# Patient Record
Sex: Female | Born: 1995 | Race: Black or African American | Hispanic: No | Marital: Single | Smoking: Never smoker
Health system: Southern US, Community
[De-identification: ages and names within clinical notes are randomized; demographics above are authoritative.]

## PROBLEM LIST (undated history)

## (undated) DIAGNOSIS — D649 Anemia, unspecified: Secondary | ICD-10-CM

## (undated) HISTORY — PX: NO PAST SURGERIES: SHX2092

---

## 2015-02-27 HISTORY — PX: DILATION AND CURETTAGE OF UTERUS: SHX78

## 2019-12-02 ENCOUNTER — Emergency Department (HOSPITAL_COMMUNITY)
Admission: EM | Admit: 2019-12-02 | Discharge: 2019-12-02 | Disposition: A | Discharge status: Home / Self Care | Payer: Medicaid Other | Attending: Emergency Medicine | Admitting: Emergency Medicine

## 2019-12-02 ENCOUNTER — Other Ambulatory Visit: Payer: Self-pay

## 2019-12-02 ENCOUNTER — Encounter (HOSPITAL_COMMUNITY): Payer: Self-pay

## 2019-12-02 DIAGNOSIS — Z3200 Encounter for pregnancy test, result unknown: Secondary | ICD-10-CM | POA: Diagnosis not present

## 2019-12-02 DIAGNOSIS — Z5321 Procedure and treatment not carried out due to patient leaving prior to being seen by health care provider: Secondary | ICD-10-CM | POA: Insufficient documentation

## 2019-12-02 NOTE — ED Notes (Signed)
Pt states that she is going to leave due to wait times

## 2019-12-02 NOTE — ED Triage Notes (Signed)
Pt requesting pregnancy test, states she is a month and a few days late, took at home test that was negative.

## 2020-01-23 ENCOUNTER — Other Ambulatory Visit: Payer: Self-pay

## 2020-01-23 ENCOUNTER — Encounter (HOSPITAL_COMMUNITY): Payer: Self-pay | Admitting: Emergency Medicine

## 2020-01-23 ENCOUNTER — Emergency Department (HOSPITAL_COMMUNITY)
Admission: EM | Admit: 2020-01-23 | Discharge: 2020-01-24 | Disposition: A | Discharge status: Home / Self Care | Payer: Medicaid Other | Attending: Emergency Medicine | Admitting: Emergency Medicine

## 2020-01-23 DIAGNOSIS — R197 Diarrhea, unspecified: Secondary | ICD-10-CM | POA: Insufficient documentation

## 2020-01-23 DIAGNOSIS — M545 Low back pain, unspecified: Secondary | ICD-10-CM | POA: Diagnosis not present

## 2020-01-23 DIAGNOSIS — R112 Nausea with vomiting, unspecified: Secondary | ICD-10-CM | POA: Insufficient documentation

## 2020-01-23 DIAGNOSIS — E860 Dehydration: Secondary | ICD-10-CM | POA: Diagnosis not present

## 2020-01-23 DIAGNOSIS — R55 Syncope and collapse: Secondary | ICD-10-CM | POA: Diagnosis not present

## 2020-01-23 LAB — URINALYSIS, ROUTINE W REFLEX MICROSCOPIC
Bilirubin Urine: NEGATIVE
Glucose, UA: NEGATIVE mg/dL
Hgb urine dipstick: NEGATIVE
Ketones, ur: 80 mg/dL — AB
Leukocytes,Ua: NEGATIVE
Nitrite: NEGATIVE
Protein, ur: NEGATIVE mg/dL
Specific Gravity, Urine: 1.023 (ref 1.005–1.030)
pH: 5 (ref 5.0–8.0)

## 2020-01-23 LAB — CBC WITH DIFFERENTIAL/PLATELET
Abs Immature Granulocytes: 0.02 10*3/uL (ref 0.00–0.07)
Basophils Absolute: 0 10*3/uL (ref 0.0–0.1)
Basophils Relative: 0 %
Eosinophils Absolute: 0.1 10*3/uL (ref 0.0–0.5)
Eosinophils Relative: 1 %
HCT: 36.2 % (ref 36.0–46.0)
Hemoglobin: 13.1 g/dL (ref 12.0–15.0)
Immature Granulocytes: 0 %
Lymphocytes Relative: 4 %
Lymphs Abs: 0.4 10*3/uL — ABNORMAL LOW (ref 0.7–4.0)
MCH: 29.7 pg (ref 26.0–34.0)
MCHC: 36.2 g/dL — ABNORMAL HIGH (ref 30.0–36.0)
MCV: 82.1 fL (ref 80.0–100.0)
Monocytes Absolute: 0.3 10*3/uL (ref 0.1–1.0)
Monocytes Relative: 3 %
Neutro Abs: 9.3 10*3/uL — ABNORMAL HIGH (ref 1.7–7.7)
Neutrophils Relative %: 92 %
Platelets: 178 10*3/uL (ref 150–400)
RBC: 4.41 MIL/uL (ref 3.87–5.11)
RDW: 14.7 % (ref 11.5–15.5)
WBC: 10.1 10*3/uL (ref 4.0–10.5)
nRBC: 0 % (ref 0.0–0.2)

## 2020-01-23 LAB — HEPATIC FUNCTION PANEL
ALT: 11 U/L (ref 0–44)
AST: 15 U/L (ref 15–41)
Albumin: 4.3 g/dL (ref 3.5–5.0)
Alkaline Phosphatase: 44 U/L (ref 38–126)
Bilirubin, Direct: 0.2 mg/dL (ref 0.0–0.2)
Indirect Bilirubin: 1 mg/dL — ABNORMAL HIGH (ref 0.3–0.9)
Total Bilirubin: 1.2 mg/dL (ref 0.3–1.2)
Total Protein: 7.8 g/dL (ref 6.5–8.1)

## 2020-01-23 LAB — LIPASE, BLOOD: Lipase: 42 U/L (ref 11–51)

## 2020-01-23 LAB — BASIC METABOLIC PANEL
Anion gap: 8 (ref 5–15)
BUN: 15 mg/dL (ref 6–20)
CO2: 21 mmol/L — ABNORMAL LOW (ref 22–32)
Calcium: 9.1 mg/dL (ref 8.9–10.3)
Chloride: 108 mmol/L (ref 98–111)
Creatinine, Ser: 0.74 mg/dL (ref 0.44–1.00)
GFR, Estimated: >60 mL/min (ref >60)
Glucose, Bld: 85 mg/dL (ref 70–99)
Potassium: 3.7 mmol/L (ref 3.5–5.1)
Sodium: 137 mmol/L (ref 135–145)

## 2020-01-23 LAB — I-STAT BETA HCG BLOOD, ED (MC, WL, AP ONLY): I-stat hCG, quantitative: <5 m[IU]/mL (ref <5)

## 2020-01-23 LAB — CBG MONITORING, ED: Glucose-Capillary: 110 mg/dL — ABNORMAL HIGH (ref 70–99)

## 2020-01-23 MED ORDER — FAMOTIDINE 20 MG PO TABS
20.0000 mg | ORAL_TABLET | Freq: Once | ORAL | Status: AC
  Administered 2020-01-24: 20 mg via ORAL
  Filled 2020-01-23: qty 1

## 2020-01-23 MED ORDER — KETOROLAC TROMETHAMINE 60 MG/2ML IM SOLN
30.0000 mg | Freq: Once | INTRAMUSCULAR | Status: AC
  Administered 2020-01-24: 30 mg via INTRAMUSCULAR
  Filled 2020-01-23: qty 2

## 2020-01-23 MED ORDER — ONDANSETRON HCL 4 MG/2ML IJ SOLN
4.0000 mg | Freq: Once | INTRAMUSCULAR | Status: AC
  Administered 2020-01-23: 4 mg via INTRAVENOUS
  Filled 2020-01-23: qty 2

## 2020-01-23 MED ORDER — MORPHINE SULFATE (PF) 2 MG/ML IV SOLN
2.0000 mg | Freq: Once | INTRAVENOUS | Status: AC
  Administered 2020-01-23: 2 mg via INTRAVENOUS
  Filled 2020-01-23: qty 1

## 2020-01-23 MED ORDER — LIDOCAINE VISCOUS HCL 2 % MT SOLN
15.0000 mL | Freq: Once | OROMUCOSAL | Status: AC
  Administered 2020-01-24: 15 mL via ORAL
  Filled 2020-01-23: qty 15

## 2020-01-23 MED ORDER — SODIUM CHLORIDE 0.9 % IV BOLUS
1000.0000 mL | Freq: Once | INTRAVENOUS | Status: AC
  Administered 2020-01-23: 1000 mL via INTRAVENOUS

## 2020-01-23 MED ORDER — ALUM & MAG HYDROXIDE-SIMETH 200-200-20 MG/5ML PO SUSP
30.0000 mL | Freq: Once | ORAL | Status: AC
  Administered 2020-01-24: 30 mL via ORAL
  Filled 2020-01-23: qty 30

## 2020-01-23 MED ORDER — SODIUM CHLORIDE 0.9 % IV BOLUS
1000.0000 mL | Freq: Once | INTRAVENOUS | Status: AC
  Administered 2020-01-24: 1000 mL via INTRAVENOUS

## 2020-01-23 NOTE — ED Provider Notes (Signed)
[facility]-EMERGENCY DEPT Provider Note   CSN: [id number] Arrival date & time: 01/23/20  2040     History Chief Complaint  Patient presents with  . Loss of Consciousness  . Abdominal Pain    Eliyana Eaton is a 24 y.o. female presents to the ED for evaluation of nausea, vomiting, abdominal pain, low back pain and breast tenderness for the last week. Has vomited 6 times in the last 24 hours, no blood no coffee ground emesis. Also reports 1 episode of yellow diarrhea today.  Her abdominal pain is "all over", constant, worse with movement, better if she lays in a fetal position on her left side.  Reports long history of acid reflux since pregnancy 2 years ago.  Feels like this is worse lately. Has sour taste in her throat and is regurgitating food into her throat which makes her more nauseated and causes vomit.  Not taking anything for acid reflux. Feels some shortness of breath like there is resistance when she is taking deep breaths but no pain with breathing.  Earlier today she was working at [employer] and felt lightheaded while driving.  She pulled over on the side of the highway.  States she woke up 2 to 3 hours later and she was curled up underneath the steering wheel.  She thinks she may have passed out.  She googled nearby hospitals and drove to "a hospice".  Staff there called 911.  Patient declined transport and drove herself to the ED.  Reports feeling a little lightheaded on route.  Denies previous syncope, seizures history. Denies any chest pain, palpitations.  Has had limited oral intake of food, water today due to nausea.  No alcohol or recreational drugs including marijuana.  No known cardiac problems.  Fully vaccinated for COVID, last dose August.  Has not been around anybody sick. Had one Depo injection in may and did not go back for second in August. Sexually active with one female without condom use. Unknown LMP, around May.  No previous abdominal surgeries.  No known  history of gastritis, ulcers.  Denies fever, URI symptoms like nasal congestion, sore throat, cough.  Denies abnormal vaginal bleeding, abnormal vaginal discharge.  Denies dysuria, hematuria urinary frequency or urgency.  HPI     History reviewed. No pertinent past medical history.  There are no problems to display for this patient.   History reviewed. No pertinent surgical history.   OB History   No obstetric history on file.     No family history on file.  Social History   Tobacco Use  . Smoking status: Not on file  Substance Use Topics  . Alcohol use: Not on file  . Drug use: Not on file    Home Medications Prior to Admission medications   Medication Sig Start Date End Date Taking? Authorizing Provider  famotidine (PEPCID) 20 MG tablet Take 1 tablet (20 mg total) by mouth 2 (two) times daily with a meal. Take as needed after meals for acid reflux 01/24/20 02/23/20  Liberty Handy, PA-C  omeprazole (PRILOSEC) 40 MG capsule Take 1 capsule (40 mg total) by mouth daily. 01/24/20 02/23/20  Liberty Handy, PA-C  ondansetron (ZOFRAN ODT) 4 MG disintegrating tablet Take 1 tablet (4 mg total) by mouth every 8 (eight) hours as needed for up to 7 days for nausea or vomiting. 01/24/20 01/31/20  Liberty Handy, PA-C    Allergies    Patient has no known allergies.  Review of Systems  Review of Systems  Constitutional: Positive for appetite change.  Gastrointestinal: Positive for abdominal pain, diarrhea, nausea and vomiting.  Neurological: Positive for syncope (?) and light-headedness.  All other systems reviewed and are negative.   Physical Exam Updated Vital Signs BP 99/87 (BP Location: Left Arm)   Pulse 99   Temp 99 F (37.2 C) (Oral)   Resp 19   Ht 5\' 3"  (1.6 m)   Wt 59 kg   SpO2 100%   BMI 23.03 kg/m   Physical Exam Vitals and nursing note reviewed.  Constitutional:      Appearance: She is well-developed.     Comments: Non toxic in NAD  HENT:       Head: Normocephalic and atraumatic.     Nose: Nose normal.     Mouth/Throat:     Comments: Lips dry/chapped. Tongue/MM tacky.  Eyes:     Conjunctiva/sclera: Conjunctivae normal.  Cardiovascular:     Rate and Rhythm: Normal rate and regular rhythm.     Comments: No murmurs. No LE edema. No calf tenderness Pulmonary:     Effort: Pulmonary effort is normal.     Breath sounds: Normal breath sounds.  Abdominal:     General: Bowel sounds are normal.     Palpations: Abdomen is soft.     Tenderness: There is abdominal tenderness in the periumbilical area.     Comments: No G/R/R. No suprapubic or CVA tenderness. Negative Murphy's and McBurney's. Active BS to lower quadrants.   Musculoskeletal:        General: Normal range of motion.     Cervical back: Normal range of motion.  Skin:    General: Skin is warm and dry.     Capillary Refill: Capillary refill takes less than 2 seconds.  Neurological:     Mental Status: She is alert.  Psychiatric:        Behavior: Behavior normal.     ED Results / Procedures / Treatments   Labs (all labs ordered are listed, but only abnormal results are displayed) Labs Reviewed  BASIC METABOLIC PANEL - Abnormal; Notable for the following components:      Result Value   CO2 21 (*)    All other components within normal limits  URINALYSIS, ROUTINE W REFLEX MICROSCOPIC - Abnormal; Notable for the following components:   Ketones, ur 80 (*)    All other components within normal limits  HEPATIC FUNCTION PANEL - Abnormal; Notable for the following components:   Indirect Bilirubin 1.0 (*)    All other components within normal limits  CBC WITH DIFFERENTIAL/PLATELET - Abnormal; Notable for the following components:   MCHC 36.2 (*)    Neutro Abs 9.3 (*)    Lymphs Abs 0.4 (*)    All other components within normal limits  CBG MONITORING, ED - Abnormal; Notable for the following components:   Glucose-Capillary 110 (*)    All other components within normal  limits  LIPASE, BLOOD  I-STAT BETA HCG BLOOD, ED ([facility], [facility], [facility] ONLY)    EKG EKG Interpretation  Date/Time:  Saturday January 23 2020 20:54:01 EST Ventricular Rate:  103 PR Interval:    QRS Duration: 116 QT Interval:  341 QTC Calculation: 449 R Axis:   46 Text Interpretation: Sinus tachycardia Multiform ventricular premature complexes Right atrial enlargement Nonspecific intraventricular conduction delay Low voltage, precordial leads ST elev, probable normal early repol pattern Baseline wander in lead(s) V5 12 Lead; Mason-Likar Confirmed by Benjiman CorePickering, Nathan [phone] on 01/23/2020 11:40:21 PM  Radiology No results found.  Procedures Procedures (including critical care time)  Medications Ordered in ED Medications  sodium chloride 0.9 % bolus 1,000 mL (0 mLs Intravenous Stopped 01/23/20 2323)  ondansetron (ZOFRAN) injection 4 mg (4 mg Intravenous Given 01/23/20 2205)  ketorolac (TORADOL) injection 30 mg (30 mg Intramuscular Given 01/24/20 0003)  morphine 2 MG/ML injection 2 mg (2 mg Intravenous Given 01/23/20 2322)  alum & mag hydroxide-simeth (MAALOX/MYLANTA) 200-200-20 MG/5ML suspension 30 mL (30 mLs Oral Given 01/24/20 0006)    And  lidocaine (XYLOCAINE) 2 % viscous mouth solution 15 mL (15 mLs Oral Given 01/24/20 0006)  famotidine (PEPCID) tablet 20 mg (20 mg Oral Given 01/24/20 0006)  sodium chloride 0.9 % bolus 1,000 mL (1,000 mLs Intravenous New Bag/Given (Non-Interop) 01/24/20 0006)    ED Course  I have reviewed the triage vital signs and the nursing notes.  Pertinent labs & imaging results that were available during my care of the patient were reviewed by me and considered in my medical decision making (see chart for details).  Clinical Course as of Jan 24 12  Sat Jan 23, 2020  2340 Sinus tachycardia Multiform ventricular premature complexes Right atrial enlargement Nonspecific intraventricular conduction delay Low voltage, precordial leads ST elev, probable  normal early repol pattern Baseline wander in lead(s) V5 12 Lead; Mason-Likar Confirmed by Benjiman Core [phone] on 01/23/2020 11:40:21 PM  ED EKG [CG]    Clinical Course User Index [CG] Liberty Handy, PA-C   MDM Rules/Calculators/A&P                          24 year old female here for nausea, vomiting, umbilical pain, diarrhea, lightheadedness and possible syncopal episode.  History of acid reflux not on any medicines, feels like this is worsening.  EMR, triage nurse notes reviewed to assist with history and MDM  Labs ordered: CBC, BMP, LFTs, lipase, hCG and urinalysis  Imaging ordered: EKG  Medicines ordered: Zofran, morphine, Toradol, GI cocktail, Pepcid and 2 L NS IV fluids  ER work-up personally visualized and interpreted  Labs review 80 ketones in urine reflecting likely dehydration volume depletion.  UA without infection.  No leukocytosis.  Normal electrolytes, creatinine, LFTs and lipase.  hCG is negative.  EKG shows sinus tachycardia, PVCs.  Patient has been on cardiac monitor without any events.  0010: Patient reevaluated several times.  Next session clinical improvement.  No longer having nausea, lightheadedness.  Abdominal pain has improved.  Minimally tender periumbilical area.  No peritonitis.  Negative Murphy's and McBurney's.  DDx includes viral gastroenteritis, uncontrolled GERD/gastritis.  Considered cholecystitis, appendicitis, diverticulitis but unlikely given clinical improvement, benign work-up thus far.  Will defer imaging.  Plan is to finish IV fluids, and discharged with symptomatic management.  Patient tolerating fluids. Final Clinical Impression(s) / ED Diagnoses Final diagnoses:  Nausea vomiting and diarrhea  Dehydration    Rx / DC Orders ED Discharge Orders         Ordered    ondansetron (ZOFRAN ODT) 4 MG disintegrating tablet  Every 8 hours PRN        01/24/20 0005    omeprazole (PRILOSEC) 40 MG capsule  Daily        01/24/20 0005      famotidine (PEPCID) 20 MG tablet  2 times daily with meals        01/24/20 0005           Liberty Handy, PA-C 01/24/20 0013  Benjiman Core, MD 01/24/20 1459

## 2020-01-23 NOTE — ED Triage Notes (Signed)
Patient c/o abdominal pain and breast tenderness x2 days with dizziness and LOC today. Denies N/V/D. Unknown LMP. States last depot shot x1 month ago.

## 2020-01-24 MED ORDER — ONDANSETRON 4 MG PO TBDP: 4.0000 mg | ORAL_TABLET | Freq: Three times a day (TID) | ORAL | 0 refills | Status: AC | PRN

## 2020-01-24 MED ORDER — FAMOTIDINE 20 MG PO TABS: 20.0000 mg | ORAL_TABLET | Freq: Two times a day (BID) | ORAL | 0 refills | Status: DC

## 2020-01-24 MED ORDER — OMEPRAZOLE 40 MG PO CPDR: 40.0000 mg | DELAYED_RELEASE_CAPSULE | Freq: Every day | ORAL | 0 refills | Status: DC

## 2020-01-24 NOTE — ED Notes (Signed)
Fluid challenge completed. Pt stated that she feels fine.

## 2020-01-24 NOTE — Discharge Instructions (Addendum)
You were seen in the ED for nausea, vomiting, abdominal pain, diarrhea, lightheadedness and possible fainting episode  Lab work and urinalysis today showed dehydration  You received IV fluids, nausea medicine and antiacid medicines  I suspect symptoms are from a virus or possibly due to uncontrolled acid reflux  Stay hydrated, drink at least 2 L of liquids daily  Use ondansetron every 8 hours for nausea  Take omeprazole 40 mg on an empty stomach first thing in the morning 15 to 20 minutes before eating.  Take this daily for at least 30 days.  If you have a very large meal or breakthrough acid reflux symptoms after eating take famotidine 20 mg with or after meals  Return to the ED for fever greater than 100.4, continued nausea or vomiting despite nausea medicines, blood in your vomit or in your stool, worsening lightheadedness or passing out or if your abdominal pain localizes to the right upper right lower quadrant

## 2020-04-23 ENCOUNTER — Other Ambulatory Visit: Payer: Self-pay

## 2020-04-23 ENCOUNTER — Emergency Department (HOSPITAL_COMMUNITY)
Admission: EM | Admit: 2020-04-23 | Discharge: 2020-04-23 | Disposition: A | Discharge status: Home / Self Care | Payer: Medicaid Other | Attending: Emergency Medicine | Admitting: Emergency Medicine

## 2020-04-23 ENCOUNTER — Encounter (HOSPITAL_COMMUNITY): Payer: Self-pay | Admitting: Emergency Medicine

## 2020-04-23 DIAGNOSIS — E860 Dehydration: Secondary | ICD-10-CM

## 2020-04-23 DIAGNOSIS — I951 Orthostatic hypotension: Secondary | ICD-10-CM

## 2020-04-23 DIAGNOSIS — R42 Dizziness and giddiness: Secondary | ICD-10-CM | POA: Insufficient documentation

## 2020-04-23 DIAGNOSIS — R112 Nausea with vomiting, unspecified: Secondary | ICD-10-CM | POA: Insufficient documentation

## 2020-04-23 DIAGNOSIS — M545 Low back pain, unspecified: Secondary | ICD-10-CM | POA: Diagnosis not present

## 2020-04-23 LAB — I-STAT BETA HCG BLOOD, ED (MC, WL, AP ONLY): I-stat hCG, quantitative: <5 m[IU]/mL (ref <5)

## 2020-04-23 LAB — URINALYSIS, ROUTINE W REFLEX MICROSCOPIC
Bilirubin Urine: NEGATIVE
Glucose, UA: NEGATIVE mg/dL
Hgb urine dipstick: NEGATIVE
Ketones, ur: 5 mg/dL — AB
Leukocytes,Ua: NEGATIVE
Nitrite: NEGATIVE
Protein, ur: 30 mg/dL — AB
Specific Gravity, Urine: 1.015 (ref 1.005–1.030)
pH: 7 (ref 5.0–8.0)

## 2020-04-23 LAB — CBC WITH DIFFERENTIAL/PLATELET
Abs Immature Granulocytes: 0.03 10*3/uL (ref 0.00–0.07)
Basophils Absolute: 0 10*3/uL (ref 0.0–0.1)
Basophils Relative: 0 %
Eosinophils Absolute: 0 10*3/uL (ref 0.0–0.5)
Eosinophils Relative: 0 %
HCT: 37.6 % (ref 36.0–46.0)
Hemoglobin: 13.2 g/dL (ref 12.0–15.0)
Immature Granulocytes: 0 %
Lymphocytes Relative: 8 %
Lymphs Abs: 0.8 10*3/uL (ref 0.7–4.0)
MCH: 30.1 pg (ref 26.0–34.0)
MCHC: 35.1 g/dL (ref 30.0–36.0)
MCV: 85.8 fL (ref 80.0–100.0)
Monocytes Absolute: 0.3 10*3/uL (ref 0.1–1.0)
Monocytes Relative: 3 %
Neutro Abs: 9 10*3/uL — ABNORMAL HIGH (ref 1.7–7.7)
Neutrophils Relative %: 89 %
Platelets: 178 10*3/uL (ref 150–400)
RBC: 4.38 MIL/uL (ref 3.87–5.11)
RDW: 14.2 % (ref 11.5–15.5)
WBC: 10.1 10*3/uL (ref 4.0–10.5)
nRBC: 0 % (ref 0.0–0.2)

## 2020-04-23 LAB — COMPREHENSIVE METABOLIC PANEL
ALT: 10 U/L (ref 0–44)
AST: 18 U/L (ref 15–41)
Albumin: 4.2 g/dL (ref 3.5–5.0)
Alkaline Phosphatase: 53 U/L (ref 38–126)
Anion gap: 14 (ref 5–15)
BUN: 11 mg/dL (ref 6–20)
CO2: 20 mmol/L — ABNORMAL LOW (ref 22–32)
Calcium: 9.8 mg/dL (ref 8.9–10.3)
Chloride: 105 mmol/L (ref 98–111)
Creatinine, Ser: 0.68 mg/dL (ref 0.44–1.00)
GFR, Estimated: >60 mL/min (ref >60)
Glucose, Bld: 80 mg/dL (ref 70–99)
Potassium: 3.8 mmol/L (ref 3.5–5.1)
Sodium: 139 mmol/L (ref 135–145)
Total Bilirubin: 1.1 mg/dL (ref 0.3–1.2)
Total Protein: 7.7 g/dL (ref 6.5–8.1)

## 2020-04-23 LAB — LIPASE, BLOOD: Lipase: 37 U/L (ref 11–51)

## 2020-04-23 MED ORDER — PANTOPRAZOLE SODIUM 40 MG IV SOLR
40.0000 mg | Freq: Once | INTRAVENOUS | Status: AC
  Administered 2020-04-23: 40 mg via INTRAVENOUS
  Filled 2020-04-23: qty 40

## 2020-04-23 MED ORDER — SODIUM CHLORIDE 0.9 % IV BOLUS
1000.0000 mL | Freq: Once | INTRAVENOUS | Status: AC
  Administered 2020-04-23: 1000 mL via INTRAVENOUS

## 2020-04-23 MED ORDER — ONDANSETRON HCL 4 MG/2ML IJ SOLN
4.0000 mg | Freq: Once | INTRAMUSCULAR | Status: AC
  Administered 2020-04-23: 4 mg via INTRAVENOUS
  Filled 2020-04-23: qty 2

## 2020-04-23 NOTE — ED Provider Notes (Signed)
Pt is feeling much better after her 2nd L.  She is ready for d/c.  Return if worse.   Jacalyn Lefevre, MD 04/23/20 [phone]

## 2020-04-23 NOTE — ED Triage Notes (Signed)
C/o dizziness, vomiting, sore throat, and lower back pain since Thursday.  Denies urinary complaints.

## 2020-04-23 NOTE — ED Provider Notes (Signed)
[facility] EMERGENCY DEPARTMENT Provider Note  CSN: [id number] Arrival date & time: 04/23/20 1132    History Chief Complaint  Patient presents with  . Dizziness  . Vomiting  . Sore Throat  . Back Pain    HPI  Mandy Eaton is a 25 y.o. female with no significant PMH reports 2 days of dizziness describes both as room spinning and lightheadedness, but no loss of consciousness, associated with nausea and emesis x4 as well as moderate aching low back pain. She denies any urinary complaints. No fever, cough SOB or CP. No diarrhea or constipation. She has had GERD since she was pregnant about 2 years ago. She denies any alcohol or drug use.    History reviewed. No pertinent past medical history.  History reviewed. No pertinent surgical history.  No family history on file.  Social History   Tobacco Use  . Smoking status: Never Smoker  . Smokeless tobacco: Never Used  Substance Use Topics  . Alcohol use: Not Currently  . Drug use: Not Currently     Home Medications Prior to Admission medications   Medication Sig Start Date End Date Taking? Authorizing Provider  famotidine (PEPCID) 20 MG tablet Take 1 tablet (20 mg total) by mouth 2 (two) times daily with a meal. Take as needed after meals for acid reflux 01/24/20 02/23/20  Liberty Handy, PA-C  omeprazole (PRILOSEC) 40 MG capsule Take 1 capsule (40 mg total) by mouth daily. 01/24/20 02/23/20  Liberty Handy, PA-C     Allergies    Patient has no known allergies.   Review of Systems   Review of Systems A comprehensive review of systems was completed and negative except as noted in HPI.    Physical Exam BP 94/62 (BP Location: Right Arm)   Pulse 67   Temp 98.1 F (36.7 C)   Resp 16   LMP 04/09/2020   SpO2 100%   Physical Exam Vitals and nursing note reviewed.  Constitutional:      Appearance: Normal appearance.  HENT:     Head: Normocephalic and atraumatic.     Nose: Nose normal.     Mouth/Throat:      Mouth: Mucous membranes are moist.  Eyes:     Extraocular Movements: Extraocular movements intact.     Conjunctiva/sclera: Conjunctivae normal.  Cardiovascular:     Rate and Rhythm: Normal rate.  Pulmonary:     Effort: Pulmonary effort is normal.     Breath sounds: Normal breath sounds.  Abdominal:     General: Abdomen is flat.     Palpations: Abdomen is soft.     Tenderness: There is no abdominal tenderness. There is no guarding.  Musculoskeletal:        General: No swelling. Normal range of motion.     Cervical back: Neck supple.  Skin:    General: Skin is warm and dry.  Neurological:     General: No focal deficit present.     Mental Status: She is alert.  Psychiatric:        Mood and Affect: Mood normal.      ED Results / Procedures / Treatments   Labs (all labs ordered are listed, but only abnormal results are displayed) Labs Reviewed  COMPREHENSIVE METABOLIC PANEL  CBC WITH DIFFERENTIAL/PLATELET  LIPASE, BLOOD  URINALYSIS, ROUTINE W REFLEX MICROSCOPIC  I-STAT BETA HCG BLOOD, ED ([facility], [facility], [facility] ONLY)    EKG None  Radiology No results found.  Procedures Procedures  Medications Ordered  in the ED Medications  ondansetron (ZOFRAN) injection 4 mg (has no administration in time range)  sodium chloride 0.9 % bolus 1,000 mL (has no administration in time range)  pantoprazole (PROTONIX) injection 40 mg (has no administration in time range)     MDM Rules/Calculators/A&P MDM Patient with non-specific dizziness, not clearly vertigo or near-syncope. Will give IVF, zofran, protonix and check labs. Exam is otherwise benign.  ED Course  I have reviewed the triage vital signs and the nursing notes.  Pertinent labs & imaging results that were available during my care of the patient were reviewed by me and considered in my medical decision making (see chart for details).  Clinical Course as of 04/24/20 0716  Sat Apr 23, 2020  1235 CBC is normal. Hcg is neg.  [CS]  1341  CMP and Lipase are normal.  [CS]  1341 BP is borderline low, will check orthostatics after IVF complete. No fever, tachycardia or leukocytosis to suggest sepsis.  [CS]  1522 Patient markedly orthostatic after IVF. Will give additional fluids and recheck. Care of the patient signed out to Dr. Particia Nearing at the change of shift.  [CS]    Clinical Course User Index [CS] Pollyann Savoy, MD    Final Clinical Impression(s) / ED Diagnoses Final diagnoses:  None    Rx / DC Orders ED Discharge Orders    None       Pollyann Savoy, MD 04/24/20 [phone]

## 2020-04-23 NOTE — ED Notes (Signed)
Dr. Bernette Mayers notified of orthostatic v/s.

## 2020-09-25 ENCOUNTER — Encounter (HOSPITAL_BASED_OUTPATIENT_CLINIC_OR_DEPARTMENT_OTHER): Payer: Self-pay | Admitting: *Deleted

## 2020-09-25 ENCOUNTER — Other Ambulatory Visit: Payer: Self-pay

## 2020-09-25 ENCOUNTER — Emergency Department (HOSPITAL_BASED_OUTPATIENT_CLINIC_OR_DEPARTMENT_OTHER)
Admission: EM | Admit: 2020-09-25 | Discharge: 2020-09-25 | Disposition: A | Discharge status: Home / Self Care | Payer: Medicaid Other | Attending: Emergency Medicine | Admitting: Emergency Medicine

## 2020-09-25 DIAGNOSIS — R109 Unspecified abdominal pain: Secondary | ICD-10-CM | POA: Insufficient documentation

## 2020-09-25 DIAGNOSIS — R300 Dysuria: Secondary | ICD-10-CM | POA: Diagnosis not present

## 2020-09-25 DIAGNOSIS — N390 Urinary tract infection, site not specified: Secondary | ICD-10-CM

## 2020-09-25 DIAGNOSIS — E876 Hypokalemia: Secondary | ICD-10-CM

## 2020-09-25 LAB — CBC WITH DIFFERENTIAL/PLATELET
Abs Immature Granulocytes: 0.04 10*3/uL (ref 0.00–0.07)
Basophils Absolute: 0 10*3/uL (ref 0.0–0.1)
Basophils Relative: 0 %
Eosinophils Absolute: 0 10*3/uL (ref 0.0–0.5)
Eosinophils Relative: 0 %
HCT: 33.6 % — ABNORMAL LOW (ref 36.0–46.0)
Hemoglobin: 12.2 g/dL (ref 12.0–15.0)
Immature Granulocytes: 0 %
Lymphocytes Relative: 6 %
Lymphs Abs: 0.8 10*3/uL (ref 0.7–4.0)
MCH: 29.3 pg (ref 26.0–34.0)
MCHC: 36.3 g/dL — ABNORMAL HIGH (ref 30.0–36.0)
MCV: 80.8 fL (ref 80.0–100.0)
Monocytes Absolute: 0.5 10*3/uL (ref 0.1–1.0)
Monocytes Relative: 4 %
Neutro Abs: 11.6 10*3/uL — ABNORMAL HIGH (ref 1.7–7.7)
Neutrophils Relative %: 90 %
Platelets: 195 10*3/uL (ref 150–400)
RBC: 4.16 MIL/uL (ref 3.87–5.11)
RDW: 13.3 % (ref 11.5–15.5)
WBC: 13 10*3/uL — ABNORMAL HIGH (ref 4.0–10.5)
nRBC: 0 % (ref 0.0–0.2)

## 2020-09-25 LAB — COMPREHENSIVE METABOLIC PANEL
ALT: 8 U/L (ref 0–44)
AST: 13 U/L — ABNORMAL LOW (ref 15–41)
Albumin: 3.8 g/dL (ref 3.5–5.0)
Alkaline Phosphatase: 45 U/L (ref 38–126)
Anion gap: 7 (ref 5–15)
BUN: 12 mg/dL (ref 6–20)
CO2: 26 mmol/L (ref 22–32)
Calcium: 9 mg/dL (ref 8.9–10.3)
Chloride: 104 mmol/L (ref 98–111)
Creatinine, Ser: 0.7 mg/dL (ref 0.44–1.00)
GFR, Estimated: >60 mL/min (ref >60)
Glucose, Bld: 99 mg/dL (ref 70–99)
Potassium: 3.4 mmol/L — ABNORMAL LOW (ref 3.5–5.1)
Sodium: 137 mmol/L (ref 135–145)
Total Bilirubin: 0.5 mg/dL (ref 0.3–1.2)
Total Protein: 6.7 g/dL (ref 6.5–8.1)

## 2020-09-25 LAB — URINALYSIS, ROUTINE W REFLEX MICROSCOPIC
Bilirubin Urine: NEGATIVE
Glucose, UA: NEGATIVE mg/dL
Ketones, ur: NEGATIVE mg/dL
Nitrite: POSITIVE — AB
Protein, ur: 100 mg/dL — AB
RBC / HPF: >50 RBC/hpf — ABNORMAL HIGH (ref 0–5)
Specific Gravity, Urine: 1.021 (ref 1.005–1.030)
WBC, UA: >50 WBC/hpf — ABNORMAL HIGH (ref 0–5)
pH: 6 (ref 5.0–8.0)

## 2020-09-25 LAB — PREGNANCY, URINE: Preg Test, Ur: NEGATIVE

## 2020-09-25 LAB — LIPASE, BLOOD: Lipase: 43 U/L (ref 11–51)

## 2020-09-25 MED ORDER — PHENAZOPYRIDINE HCL 100 MG PO TABS
200.0000 mg | ORAL_TABLET | Freq: Once | ORAL | Status: AC
  Administered 2020-09-25: 200 mg via ORAL
  Filled 2020-09-25: qty 2

## 2020-09-25 MED ORDER — CEPHALEXIN 500 MG PO CAPS: 500.0000 mg | ORAL_CAPSULE | Freq: Four times a day (QID) | ORAL | 0 refills | Status: DC

## 2020-09-25 MED ORDER — PHENAZOPYRIDINE HCL 200 MG PO TABS: 200.0000 mg | ORAL_TABLET | Freq: Three times a day (TID) | ORAL | 0 refills | Status: DC | PRN

## 2020-09-25 MED ORDER — LACTATED RINGERS IV BOLUS
1000.0000 mL | Freq: Once | INTRAVENOUS | Status: AC
  Administered 2020-09-25: 1000 mL via INTRAVENOUS

## 2020-09-25 MED ORDER — POTASSIUM CHLORIDE CRYS ER 20 MEQ PO TBCR
40.0000 meq | EXTENDED_RELEASE_TABLET | Freq: Once | ORAL | Status: AC
  Administered 2020-09-25: 40 meq via ORAL
  Filled 2020-09-25: qty 2

## 2020-09-25 MED ORDER — ACETAMINOPHEN 500 MG PO TABS
1000.0000 mg | ORAL_TABLET | Freq: Once | ORAL | Status: AC
  Administered 2020-09-25: 1000 mg via ORAL
  Filled 2020-09-25: qty 2

## 2020-09-25 MED ORDER — SODIUM CHLORIDE 0.9 % IV SOLN
1.0000 g | Freq: Once | INTRAVENOUS | Status: AC
  Administered 2020-09-25: 1 g via INTRAVENOUS
  Filled 2020-09-25: qty 10

## 2020-09-25 NOTE — ED Triage Notes (Addendum)
C/o lower abd pain that started yesterday. Also, c/o pain to her vaginal area. Describes as sharp. States pain is constant. C/o pain with urination and lower back pain. Denies any fevers. Denies n/v. Has not taken anything for pain. Pt states she drove herself to the ED

## 2020-09-25 NOTE — ED Provider Notes (Addendum)
[facility] EMERGENCY DEPT Provider Note   CSN: [id number] Arrival date & time: 09/25/20  2595     History Chief Complaint  Patient presents with   Abdominal Pain    Mandy Eaton is a 25 y.o. female.  Patient c/o urinary urgency and dysuria, and suprapubic pain in the past 1-2 days. Symptoms acute onset, moderate-severe, constant, sharp to burning, non radiating. No nausea/vomiting, normal appetite. Denies fever or chills. Denies hx uti or recent antibiotic use. States had recent used a new fragranted soap product in area. Denies vaginal discharge or bleeding. Last menstrual period was 1 week ago. Having normal bms, no diarrhea or constipation.   The history is provided by the patient.  Abdominal Pain Associated symptoms: dysuria   Associated symptoms: no chest pain, no cough, no diarrhea, no fever, no shortness of breath, no sore throat, no vaginal bleeding, no vaginal discharge and no vomiting       History reviewed. No pertinent past medical history.  There are no problems to display for this patient.   History reviewed. No pertinent surgical history.   OB History   No obstetric history on file.     No family history on file.  Social History   Tobacco Use   Smoking status: Never   Smokeless tobacco: Never  Substance Use Topics   Alcohol use: Not Currently   Drug use: Not Currently    Home Medications Prior to Admission medications   Not on File    Allergies    Patient has no known allergies.  Review of Systems   Review of Systems  Constitutional:  Negative for fever.  HENT:  Negative for sore throat.   Eyes:  Negative for redness.  Respiratory:  Negative for cough and shortness of breath.   Cardiovascular:  Negative for chest pain.  Gastrointestinal:  Positive for abdominal pain. Negative for diarrhea and vomiting.  Genitourinary:  Positive for dysuria, frequency and urgency. Negative for flank pain, vaginal bleeding and vaginal  discharge.  Musculoskeletal:  Negative for back pain.  Skin:  Negative for rash.  Neurological:  Negative for headaches.  Hematological:  Does not bruise/bleed easily.  Psychiatric/Behavioral:  Negative for confusion.    Physical Exam Updated Vital Signs BP 95/65 (BP Location: Right Arm)   Pulse 95   Temp 99.9 F (37.7 C) (Oral)   Resp 18   Ht 1.6 m (5\' 3" )   Wt 47 kg   LMP 09/13/2020 (Approximate)   SpO2 98%   BMI 18.37 kg/m   Physical Exam Vitals and nursing note reviewed.  Constitutional:      Appearance: Normal appearance. She is well-developed.  HENT:     Head: Atraumatic.     Nose: Nose normal.     Mouth/Throat:     Mouth: Mucous membranes are moist.  Eyes:     General: No scleral icterus.    Conjunctiva/sclera: Conjunctivae normal.  Neck:     Trachea: No tracheal deviation.  Cardiovascular:     Rate and Rhythm: Normal rate and regular rhythm.     Pulses: Normal pulses.     Heart sounds: Normal heart sounds. No murmur heard.   No friction rub. No gallop.  Pulmonary:     Effort: Pulmonary effort is normal. No respiratory distress.     Breath sounds: Normal breath sounds.  Abdominal:     General: Bowel sounds are normal. There is no distension.     Palpations: Abdomen is soft. There is no mass.  Tenderness: There is no guarding or rebound.     Comments: Suprapubic tenderness.  Genitourinary:    Comments: No cva tenderness.  Musculoskeletal:        General: No swelling.     Cervical back: Normal range of motion and neck supple. No rigidity. No muscular tenderness.  Skin:    General: Skin is warm and dry.     Findings: No rash.  Neurological:     Mental Status: She is alert.     Comments: Alert, speech normal.   Psychiatric:        Mood and Affect: Mood normal.    ED Results / Procedures / Treatments   Labs (all labs ordered are listed, but only abnormal results are displayed) Results for orders placed or performed during the hospital encounter  of 09/25/20  Urinalysis, Routine w reflex microscopic Urine, Clean Catch  Result Value Ref Range   Color, Urine YELLOW YELLOW   APPearance HAZY (A) CLEAR   Specific Gravity, Urine 1.021 1.005 - 1.030   pH 6.0 5.0 - 8.0   Glucose, UA NEGATIVE NEGATIVE mg/dL   Hgb urine dipstick LARGE (A) NEGATIVE   Bilirubin Urine NEGATIVE NEGATIVE   Ketones, ur NEGATIVE NEGATIVE mg/dL   Protein, ur 409 (A) NEGATIVE mg/dL   Nitrite POSITIVE (A) NEGATIVE   Leukocytes,Ua LARGE (A) NEGATIVE   RBC / HPF >50 (H) 0 - 5 RBC/hpf   WBC, UA >50 (H) 0 - 5 WBC/hpf   WBC Clumps PRESENT    Mucus PRESENT    Budding Yeast PRESENT   CBC with Differential/Platelet  Result Value Ref Range   WBC 13.0 (H) 4.0 - 10.5 K/uL   RBC 4.16 3.87 - 5.11 MIL/uL   Hemoglobin 12.2 12.0 - 15.0 g/dL   HCT 81.1 (L) 91.4 - 78.2 %   MCV 80.8 80.0 - 100.0 fL   MCH 29.3 26.0 - 34.0 pg   MCHC 36.3 (H) 30.0 - 36.0 g/dL   RDW 95.6 21.3 - 08.6 %   Platelets 195 150 - 400 K/uL   nRBC 0.0 0.0 - 0.2 %   Neutrophils Relative % 90 %   Neutro Abs 11.6 (H) 1.7 - 7.7 K/uL   Lymphocytes Relative 6 %   Lymphs Abs 0.8 0.7 - 4.0 K/uL   Monocytes Relative 4 %   Monocytes Absolute 0.5 0.1 - 1.0 K/uL   Eosinophils Relative 0 %   Eosinophils Absolute 0.0 0.0 - 0.5 K/uL   Basophils Relative 0 %   Basophils Absolute 0.0 0.0 - 0.1 K/uL   Immature Granulocytes 0 %   Abs Immature Granulocytes 0.04 0.00 - 0.07 K/uL  Pregnancy, urine  Result Value Ref Range   Preg Test, Ur NEGATIVE NEGATIVE     EKG None  Radiology No results found.  Procedures Procedures   Medications Ordered in ED Medications  lactated ringers bolus 1,000 mL (has no administration in time range)  cefTRIAXone (ROCEPHIN) 1 g in sodium chloride 0.9 % 100 mL IVPB (has no administration in time range)  phenazopyridine (PYRIDIUM) tablet 200 mg (has no administration in time range)  acetaminophen (TYLENOL) tablet 1,000 mg (has no administration in time range)    ED Course   I have reviewed the triage vital signs and the nursing notes.  Pertinent labs & imaging results that were available during my care of the patient were reviewed by me and considered in my medical decision making (see chart for details).    MDM Rules/Calculators/A&P  Labs sent.   Reviewed nursing notes and prior charts for additional history.   Labs reviewed/interpreted by me - UA with large LE, nitrite +, > 50 wbc. Pt also w urinary urgency, frequency and dysuria as well as suprapubic tenderness/pain. Will tx for uti.  Preg neg.   Iv LR bolus. Rocephin iv. Pyridium po. Acetaminophen po.   Additional labs reviewed/interpreted by me - k mildly low. Kcl po.   Pt currently appears stable for d/c.   Return precautions provided.   Final Clinical Impression(s) / ED Diagnoses Final diagnoses:  None    Rx / DC Orders ED Discharge Orders     None          Cathren Laine, MD 09/25/20 [phone]

## 2020-09-25 NOTE — ED Notes (Signed)
Pt states she has never swallowed pills before but she did so with ginger ale and applesauce

## 2020-09-25 NOTE — Discharge Instructions (Addendum)
It was our pleasure to provide your ER care today - we hope that you feel better.  Drink plenty of fluids/stay well hydrated.   Take antibiotic (keflex) as prescribed for urine infection - complete the full course, and do not skip or miss any doses.   You may take pyridium as need for bladder spasm/pain. Yo may also take acetaminophen or ibuprofen as need.   Follow up with primary care doctor in 2-3 days if symptoms fail to improve/resolve.  Also from today's labs, your potassium level is slightly low - eat plenty of fruits and vegetables, and follow up with primary care doctor.   Return to ER if worse, new symptoms, high fevers, new, worsening, or severe pain, persistent vomiting, or other concern.

## 2020-12-31 ENCOUNTER — Other Ambulatory Visit: Payer: Self-pay

## 2020-12-31 ENCOUNTER — Emergency Department (HOSPITAL_COMMUNITY)
Admission: EM | Admit: 2020-12-31 | Discharge: 2020-12-31 | Disposition: A | Discharge status: Home / Self Care | Payer: Medicaid Other | Attending: Emergency Medicine | Admitting: Emergency Medicine

## 2020-12-31 ENCOUNTER — Encounter (HOSPITAL_COMMUNITY): Payer: Self-pay | Admitting: Emergency Medicine

## 2020-12-31 DIAGNOSIS — S81801A Unspecified open wound, right lower leg, initial encounter: Secondary | ICD-10-CM | POA: Diagnosis not present

## 2020-12-31 DIAGNOSIS — W540XXA Bitten by dog, initial encounter: Secondary | ICD-10-CM | POA: Diagnosis not present

## 2020-12-31 DIAGNOSIS — S8991XA Unspecified injury of right lower leg, initial encounter: Secondary | ICD-10-CM | POA: Diagnosis present

## 2020-12-31 MED ORDER — AMOXICILLIN-POT CLAVULANATE 875-125 MG PO TABS: 1.0000 | ORAL_TABLET | Freq: Two times a day (BID) | ORAL | 0 refills | Status: DC

## 2020-12-31 NOTE — ED Notes (Signed)
Pt d/c paperwork given by PA.

## 2020-12-31 NOTE — ED Triage Notes (Signed)
Pt states she was bit by a dog on the back of her RLE. Unsure if dog's vaccination status.

## 2020-12-31 NOTE — ED Provider Notes (Signed)
[facility] EMERGENCY DEPARTMENT Provider Note   CSN: [id number] Arrival date & time: 12/31/20  2049     History Chief Complaint  Patient presents with   Animal Bite     Mandy Eaton is a 25 y.o. female no significant past medical history presents after sustaining a bite from a dog the back of her right lower extremity, located just proximal to her Achilles tendon.  Patient reports minimal bleeding, some pain, is able to ambulate without difficulty.  Patient has not taken anything for pain at this time.  Patient works for [employer], reports she needs a note to return to work, and is concerned for possible infection.  Patient reports that she is no idea about dog's vaccination status, however they were living in a residential neighborhood, wearing a collar, not frothing at the mouth.   Animal Bite     History reviewed. No pertinent past medical history.  There are no problems to display for this patient.   History reviewed. No pertinent surgical history.   OB History   No obstetric history on file.     No family history on file.  Social History   Tobacco Use   Smoking status: Never   Smokeless tobacco: Never  Substance Use Topics   Alcohol use: Not Currently   Drug use: Not Currently    Home Medications Prior to Admission medications   Medication Sig Start Date End Date Taking? Authorizing Provider  amoxicillin-clavulanate (AUGMENTIN) 875-125 MG tablet Take 1 tablet by mouth every 12 (twelve) hours. 12/31/20  Yes Connelly [place] H, PA-C  cephALEXin (KEFLEX) 500 MG capsule Take 1 capsule (500 mg total) by mouth 4 (four) times daily. 09/25/20   Cathren Laine, MD  phenazopyridine (PYRIDIUM) 200 MG tablet Take 1 tablet (200 mg total) by mouth 3 (three) times daily as needed for pain. 09/25/20   Cathren Laine, MD    Allergies    Patient has no known allergies.  Review of Systems   Review of Systems  Skin:  Positive for wound.  All other systems  reviewed and are negative.  Physical Exam Updated Vital Signs BP 104/78 (BP Location: Right Arm)   Pulse 87   Temp 97.8 F (36.6 C) (Oral)   Resp 16   SpO2 100%   Physical Exam Vitals and nursing note reviewed.  Constitutional:      General: She is not in acute distress.    Appearance: Normal appearance.  HENT:     Head: Normocephalic and atraumatic.  Eyes:     General:        Right eye: No discharge.        Left eye: No discharge.  Cardiovascular:     Rate and Rhythm: Normal rate and regular rhythm.     Pulses: Normal pulses.  Pulmonary:     Effort: Pulmonary effort is normal. No respiratory distress.  Musculoskeletal:        General: No deformity.     Comments: Intact dorsiflexion, plantar flexion of the right lower extremity.  Patient has intact sensation in the entire right lower extremity.  5 out of 5 throughout right lower extremity.  Skin:    General: Skin is warm and dry.     Capillary Refill: Capillary refill takes less than 2 seconds.     Comments: Some redness, excoriation without laceration, or clear puncture wound located at the posterior aspect of right lower extremity just proximal to Achilles tendon, midline below.  No evidence of swelling,  purulent drainage, redness.  Some tenderness to palpation.  Neurological:     Mental Status: She is alert and oriented to person, place, and time.  Psychiatric:        Mood and Affect: Mood normal.        Behavior: Behavior normal.    ED Results / Procedures / Treatments   Labs (all labs ordered are listed, but only abnormal results are displayed) Labs Reviewed - No data to display  EKG None  Radiology No results found.  Procedures Procedures   Medications Ordered in ED Medications - No data to display  ED Course  I have reviewed the triage vital signs and the nursing notes.  Pertinent labs & imaging results that were available during my care of the patient were reviewed by me and considered in my  medical decision making (see chart for details).    MDM Rules/Calculators/A&P                         Patient with some tenderness, signs of excoriation secondary to dog bite.  Patient with no signs of active infection, however discussed high risk for potential infection.  No laceration or puncture wound that required repair at this time.  Entire right lower extremity is neurovascularly intact at this time.  We will provide her with prescription for Augmentin, encouraged close follow-up, and reevaluation at community wellness, primary care, or emergency department if she starts to develop signs of infection.  Encourage patient to follow-up with the neighborhood to try to identify her vaccination status for the dog, however extremely low rate of rabies, high rate of vaccination, especially for dog wearing collar as this is mandated by shelters, animal rescue, etc. patient discharged in stable condition, return precautions given. Final Clinical Impression(s) / ED Diagnoses Final diagnoses:  Dog bite, initial encounter    Rx / DC Orders ED Discharge Orders          Ordered    amoxicillin-clavulanate (AUGMENTIN) 875-125 MG tablet  Every 12 hours        12/31/20 2112             [place] 12/31/20 2226    Rolan Bucco, MD 01/01/21 1104

## 2020-12-31 NOTE — Discharge Instructions (Addendum)
I encourage you to try to get in touch with the owners of the animal if you are able to for further evaluation of dog's vaccination status. Please take the entire course of antibiotics, please follow up with your PCP or the community wellness center if pain in leg worsens or fails to improve, or you show signs of infection including redness, heat, swelling, pus draining from affected area.

## 2021-02-04 ENCOUNTER — Inpatient Hospital Stay (HOSPITAL_COMMUNITY)
Admission: AD | Admit: 2021-02-04 | Discharge: 2021-02-04 | Disposition: A | Discharge status: Home / Self Care | Payer: Medicaid Other | Attending: Obstetrics & Gynecology | Admitting: Obstetrics & Gynecology

## 2021-02-04 ENCOUNTER — Other Ambulatory Visit: Payer: Self-pay

## 2021-02-04 NOTE — MAU Note (Signed)
MAU registration called and stated patient left and signed AMA form. Gerrit Heck, CNM, notified.

## 2021-03-03 ENCOUNTER — Encounter (HOSPITAL_BASED_OUTPATIENT_CLINIC_OR_DEPARTMENT_OTHER): Payer: Self-pay | Admitting: *Deleted

## 2021-03-03 ENCOUNTER — Emergency Department (HOSPITAL_BASED_OUTPATIENT_CLINIC_OR_DEPARTMENT_OTHER): Payer: Medicaid Other | Admitting: Radiology

## 2021-03-03 ENCOUNTER — Other Ambulatory Visit: Payer: Self-pay

## 2021-03-03 ENCOUNTER — Emergency Department (HOSPITAL_BASED_OUTPATIENT_CLINIC_OR_DEPARTMENT_OTHER)
Admission: EM | Admit: 2021-03-03 | Discharge: 2021-03-03 | Disposition: A | Discharge status: Home / Self Care | Payer: Medicaid Other | Attending: Emergency Medicine | Admitting: Emergency Medicine

## 2021-03-03 ENCOUNTER — Emergency Department (HOSPITAL_BASED_OUTPATIENT_CLINIC_OR_DEPARTMENT_OTHER): Payer: Medicaid Other

## 2021-03-03 DIAGNOSIS — S0990XA Unspecified injury of head, initial encounter: Secondary | ICD-10-CM | POA: Insufficient documentation

## 2021-03-03 DIAGNOSIS — W109XXA Fall (on) (from) unspecified stairs and steps, initial encounter: Secondary | ICD-10-CM | POA: Insufficient documentation

## 2021-03-03 DIAGNOSIS — U071 COVID-19: Secondary | ICD-10-CM | POA: Insufficient documentation

## 2021-03-03 DIAGNOSIS — R55 Syncope and collapse: Secondary | ICD-10-CM

## 2021-03-03 DIAGNOSIS — Z79899 Other long term (current) drug therapy: Secondary | ICD-10-CM | POA: Diagnosis not present

## 2021-03-03 HISTORY — DX: Anemia, unspecified: D64.9

## 2021-03-03 LAB — BASIC METABOLIC PANEL
Anion gap: 8 (ref 5–15)
BUN: 14 mg/dL (ref 6–20)
CO2: 26 mmol/L (ref 22–32)
Calcium: 9.8 mg/dL (ref 8.9–10.3)
Chloride: 101 mmol/L (ref 98–111)
Creatinine, Ser: 0.64 mg/dL (ref 0.44–1.00)
GFR, Estimated: >60 mL/min (ref >60)
Glucose, Bld: 87 mg/dL (ref 70–99)
Potassium: 3.9 mmol/L (ref 3.5–5.1)
Sodium: 135 mmol/L (ref 135–145)

## 2021-03-03 LAB — URINALYSIS, ROUTINE W REFLEX MICROSCOPIC
Bilirubin Urine: NEGATIVE
Glucose, UA: NEGATIVE mg/dL
Hgb urine dipstick: NEGATIVE
Ketones, ur: 40 mg/dL — AB
Leukocytes,Ua: NEGATIVE
Nitrite: NEGATIVE
Specific Gravity, Urine: 1.025 (ref 1.005–1.030)
pH: 7.5 (ref 5.0–8.0)

## 2021-03-03 LAB — CBC
HCT: 36.3 % (ref 36.0–46.0)
Hemoglobin: 13.1 g/dL (ref 12.0–15.0)
MCH: 30.6 pg (ref 26.0–34.0)
MCHC: 36.1 g/dL — ABNORMAL HIGH (ref 30.0–36.0)
MCV: 84.8 fL (ref 80.0–100.0)
Platelets: 161 10*3/uL (ref 150–400)
RBC: 4.28 MIL/uL (ref 3.87–5.11)
RDW: 15 % (ref 11.5–15.5)
WBC: 8.5 10*3/uL (ref 4.0–10.5)
nRBC: 0 % (ref 0.0–0.2)

## 2021-03-03 LAB — HCG, QUANTITATIVE, PREGNANCY: hCG, Beta Chain, Quant, S: <1 m[IU]/mL (ref <5)

## 2021-03-03 LAB — RESP PANEL BY RT-PCR (FLU A&B, COVID) ARPGX2
Influenza A by PCR: NEGATIVE
Influenza B by PCR: NEGATIVE
SARS Coronavirus 2 by RT PCR: POSITIVE — AB

## 2021-03-03 LAB — RAPID URINE DRUG SCREEN, HOSP PERFORMED
Amphetamines: NOT DETECTED
Barbiturates: NOT DETECTED
Benzodiazepines: NOT DETECTED
Cocaine: NOT DETECTED
Opiates: NOT DETECTED
Tetrahydrocannabinol: NOT DETECTED

## 2021-03-03 LAB — PREGNANCY, URINE: Preg Test, Ur: NEGATIVE

## 2021-03-03 MED ORDER — ACETAMINOPHEN 160 MG/5ML PO SOLN
650.0000 mg | Freq: Once | ORAL | Status: AC
  Administered 2021-03-03: 650 mg via ORAL
  Filled 2021-03-03: qty 20.3

## 2021-03-03 MED ORDER — LACTATED RINGERS IV BOLUS
1000.0000 mL | Freq: Once | INTRAVENOUS | Status: AC
  Administered 2021-03-03: 1000 mL via INTRAVENOUS

## 2021-03-03 MED ORDER — ONDANSETRON HCL 4 MG/2ML IJ SOLN
4.0000 mg | Freq: Once | INTRAMUSCULAR | Status: AC
  Administered 2021-03-03: 4 mg via INTRAVENOUS
  Filled 2021-03-03: qty 2

## 2021-03-03 MED ORDER — ONDANSETRON HCL 4 MG/5ML PO SOLN: 4.0000 mg | Freq: Three times a day (TID) | ORAL | 1 refills | Status: DC | PRN

## 2021-03-03 MED ORDER — PANTOPRAZOLE SODIUM 40 MG PO TBEC
40.0000 mg | DELAYED_RELEASE_TABLET | Freq: Once | ORAL | Status: AC
  Administered 2021-03-03: 40 mg via ORAL
  Filled 2021-03-03: qty 1

## 2021-03-03 MED ORDER — ACETAMINOPHEN 500 MG PO TABS
1000.0000 mg | ORAL_TABLET | Freq: Once | ORAL | Status: AC
  Administered 2021-03-03: 1000 mg via ORAL
  Filled 2021-03-03: qty 2

## 2021-03-03 NOTE — ED Provider Notes (Signed)
[facility] EMERGENCY DEPT Provider Note   CSN: [id number] Arrival date & time: 03/03/21  0908     History  Chief Complaint  Patient presents with   Shortness of Breath    Mandy Eaton is a 26 y.o. female presents to the ED for evaluation of body aches, 3 episodes of vomiting, nausea, lightheadedness, mild shortness of breath, sore throat, rhinorrhea today.  Additionally, the patient reports that she has had a syncopal episode today going down the stairs and fell down 5 stairs.  She reports she reports falling on the stairs, but is not sure if she "blacked out", but remembers she got immediately back up.  She denies any abdominal pain, nasal congestion, fevers, cough, chest pain, neck pain, or back pain.  She denies any medical or surgical history.  Denies any daily medications.  No known drug allergies.  Denies any tobacco, EtOH, or drug use.  LMP 02-29-2020.   Dizziness Associated symptoms: nausea, shortness of breath and vomiting   Associated symptoms: no diarrhea   Emesis Associated symptoms: myalgias and sore throat   Associated symptoms: no abdominal pain, no chills, no diarrhea and no fever   Shortness of Breath Associated symptoms: sore throat and vomiting   Associated symptoms: no abdominal pain, no fever and no neck pain       Home Medications Prior to Admission medications   Medication Sig Start Date End Date Taking? Authorizing Provider  amoxicillin-clavulanate (AUGMENTIN) 875-125 MG tablet Take 1 tablet by mouth every 12 (twelve) hours. 12/31/20   Prosperi, Christian H, PA-C  cephALEXin (KEFLEX) 500 MG capsule Take 1 capsule (500 mg total) by mouth 4 (four) times daily. 09/25/20   Lajean Saver, MD  phenazopyridine (PYRIDIUM) 200 MG tablet Take 1 tablet (200 mg total) by mouth 3 (three) times daily as needed for pain. 09/25/20   Lajean Saver, MD      Allergies    Patient has no known allergies.    Review of Systems   Review of Systems  Constitutional:   Positive for fatigue. Negative for chills and fever.  HENT:  Positive for rhinorrhea and sore throat. Negative for congestion.   Respiratory:  Positive for shortness of breath.   Gastrointestinal:  Positive for nausea and vomiting. Negative for abdominal pain, constipation and diarrhea.  Musculoskeletal:  Positive for myalgias. Negative for back pain and neck pain.  Neurological:  Positive for syncope and light-headedness.  All other systems reviewed and are negative.  Physical Exam Updated Vital Signs BP 106/71 (BP Location: Right Arm)    Pulse 90    Temp 99.8 F (37.7 C) (Oral)    Resp 12    Ht 5\' 3"  (1.6 m)    Wt 49.9 kg    SpO2 98%    BMI 19.49 kg/m  Physical Exam Vitals and nursing note reviewed.  Constitutional:      General: She is not in acute distress.    Appearance: Normal appearance. She is not toxic-appearing.  HENT:     Head: Normocephalic and atraumatic.     Comments: No step-offs or deformities palpated or visualized.  No overlying skin changes noted.    Mouth/Throat:     Mouth: Mucous membranes are moist.     Pharynx: Oropharynx is clear.     Comments: No pharyngeal erythema, edema, or exudate noted.  Moist mucous membranes.  Uvula midline.  Airway patent. Eyes:     General: No scleral icterus.    Pupils: Pupils are equal, round, and  reactive to light.  Cardiovascular:     Rate and Rhythm: Normal rate and regular rhythm.  Pulmonary:     Effort: Pulmonary effort is normal. No respiratory distress.     Breath sounds: Normal breath sounds.     Comments: Clear to auscultation bilaterally.  No respiratory distress, accessory muscle use, tripoding, nasal flaring, or cyanosis present.  Patient satting 99% on room air. Chest:     Chest wall: No tenderness.  Abdominal:     General: Abdomen is flat. Bowel sounds are normal.     Palpations: Abdomen is soft.     Tenderness: There is no abdominal tenderness. There is no guarding or rebound.  Musculoskeletal:         General: No deformity.     Cervical back: Normal range of motion.     Comments: No midline cervical, thoracic, lumbar, or sacral tenderness palpation.  Mild lumbar tenderness palpation.  No overlying skin changes noted.  No obvious deformities or step-offs noted or palpated.  Skin:    General: Skin is warm and dry.     Findings: No ecchymosis.  Neurological:     General: No focal deficit present.     Mental Status: She is alert. Mental status is at baseline.    ED Results / Procedures / Treatments   Labs (all labs ordered are listed, but only abnormal results are displayed) Labs Reviewed  RESP PANEL BY RT-PCR (FLU A&B, COVID) ARPGX2 - Abnormal; Notable for the following components:      Result Value   SARS Coronavirus 2 by RT PCR POSITIVE (*)    All other components within normal limits  CBC - Abnormal; Notable for the following components:   MCHC 36.1 (*)    All other components within normal limits  URINALYSIS, ROUTINE W REFLEX MICROSCOPIC - Abnormal; Notable for the following components:   Ketones, ur 40 (*)    Protein, ur TRACE (*)    All other components within normal limits  BASIC METABOLIC PANEL  PREGNANCY, URINE  RAPID URINE DRUG SCREEN, HOSP PERFORMED  HCG, QUANTITATIVE, PREGNANCY  CBG MONITORING, ED    EKG None  Radiology DG Chest 2 View  Result Date: 03/03/2021 CLINICAL DATA:  weakness EXAM: CHEST - 2 VIEW COMPARISON:  None. FINDINGS: The heart size and mediastinal contours are within normal limits. Both lungs are clear. The visualized skeletal structures are unremarkable. Multiple small nodular opacities projecting over the mid and lower chest are external to the patient on the lateral radiograph, likely on the patient's clothing. Mild reverse S-shaped thoracic curvature. IMPRESSION: No active cardiopulmonary disease. Electronically Signed   By: Margaretha Sheffield M.D.   On: 03/03/2021 10:18   CT Head Wo Contrast  Result Date: 03/03/2021 CLINICAL DATA:  Head  trauma, fall down stairs earlier today EXAM: CT HEAD WITHOUT CONTRAST TECHNIQUE: Contiguous axial images were obtained from the base of the skull through the vertex without intravenous contrast. COMPARISON:  None. FINDINGS: Brain: No evidence of acute infarction, hemorrhage, hydrocephalus, extra-axial collection or mass lesion/mass effect. Vascular: No hyperdense vessel or unexpected calcification. Skull: Normal. Negative for fracture or focal lesion. Sinuses/Orbits: No acute finding. Other: None. IMPRESSION: Normal head CT without contrast. Electronically Signed   By: Jerilynn Mages.  Shick M.D.   On: 03/03/2021 15:00   CT Cervical Spine Wo Contrast  Result Date: 03/03/2021 CLINICAL DATA:  Fall down stairs, neck, trauma injury EXAM: CT CERVICAL SPINE WITHOUT CONTRAST TECHNIQUE: Multidetector CT imaging of the cervical spine was performed without  intravenous contrast. Multiplanar CT image reconstructions were also generated. COMPARISON:  None. FINDINGS: Alignment: Normal. Skull base and vertebrae: No acute fracture. No primary bone lesion or focal pathologic process. Soft tissues and spinal canal: No prevertebral fluid or swelling. No visible canal hematoma. Disc levels: Preserved disc spaces and vertebral body heights. No significant degenerative process or spondylosis. Facets are aligned. No subluxation or dislocation. Intact odontoid. Upper chest: Negative. Other: None. IMPRESSION: Normal cervical spine CT without contrast. No acute osseous finding, fracture or malalignment. Electronically Signed   By: Jerilynn Mages.  Shick M.D.   On: 03/03/2021 15:04    Procedures Procedures    Medications Ordered in ED Medications  pantoprazole (PROTONIX) EC tablet 40 mg (40 mg Oral Given 03/03/21 1309)  lactated ringers bolus 1,000 mL (0 mLs Intravenous Stopped 03/03/21 1425)  ondansetron (ZOFRAN) injection 4 mg (4 mg Intravenous Given 03/03/21 1310)  acetaminophen (TYLENOL) tablet 1,000 mg (1,000 mg Oral Given 03/03/21 1634)  acetaminophen  (TYLENOL) 160 MG/5ML solution 650 mg (650 mg Oral Given 03/03/21 1638)    ED Course/ Medical Decision Making/ A&P                           Medical Decision Making  26 year old female presents the emergency department with cough and cold symptoms as well as a syncopal episode today.  Differential diagnosis includes but is not limited to dehydration, viral illness, COVID, flu, brain bleed, spinal fracture, or skull fracture.  Vital signs show the patient has mildly decreased blood pressure although once again past medical charts this is stable for her.  She is mildly elevated temperature at nine 9.8.  She remains tachycardic to high normal heart rate.  This may be associated due to her symptoms and increasing temperature.  On physical exam, she has some nasal congestion.  No pharyngeal erythema.  Moist mucous membranes.  Airway patent.  Mild lumbar paraspinal tenderness palpation with no step-off or deformity.  No midline tenderness.  No obvious signs of trauma.  No deformities or step-offs noted to the C-spine or scalp.  Labs and imaging ordered in triage.  Patient tested positive for COVID, negative for flu.  CBC shows no signs of leukocytosis or anemia.  BMP shows no electrolyte abnormality.  Creatinine 0.64.  Urinalysis shows some ketones with trace protein which is mostly due to the patient's vomiting.  Pregnancy test negative.  Will order Zofran, pantoprazole (per patient request for her GERD), along with a liter lactated Ringer's.  Tylenol ordered as well.  CT of head and neck showed no acute osseous abnormalities, fractures, or dislocations.  Chest x-ray shows no active cardiopulmonary process.  On reevaluation, the patient reports she still is having some diffuse body aches however is feeling better after the fluids. PO Challenge passed and the patient is requesting more food. The patient's reassuring vital signs, lab work, and physical exam, the patient is safe for discharge home.  I discussed  with the patient her lab and imaging findings.  Discussed with her return precautions.  Patient agree to plan.  Patient is stable and being discharged home in good condition.  Final Clinical Impression(s) / ED Diagnoses Final diagnoses:  COVID  Syncope, unspecified syncope type    Rx / DC Orders ED Discharge Orders          Ordered    ondansetron (ZOFRAN) 4 MG/5ML solution  Every 8 hours PRN        03/03/21 1710  Sherrell Puller, PA-C 03/03/21 1713    Davonna Belling, MD 03/04/21 [id number]

## 2021-03-03 NOTE — ED Triage Notes (Signed)
Pt woke with N/V chills, shob, body aches this morning

## 2021-03-03 NOTE — ED Notes (Signed)
Patient transported to CT at this Time. ?

## 2021-03-03 NOTE — ED Notes (Signed)
Patient returned from CT at this Time. ?

## 2021-03-03 NOTE — ED Notes (Signed)
After providing the Patient with Tylenol Pills, the Patient stated she cannot take Pills. Suspension will be provided. Patient did not take Tylenol Pills.

## 2021-03-03 NOTE — ED Notes (Signed)
RN provided AVS using Teachback Method. Patient verbalizes understanding of Discharge Instructions. Opportunity for Questioning and Answers were provided by RN. Patient Discharged from ED ambulatory to Home via Self.  

## 2021-03-03 NOTE — Discharge Instructions (Addendum)
You were seen here today for evaluation of your flulike symptoms and syncopal episode.  Your test results came back positive for COVID.  Your other lab work showed you had some signs of dehydration, so make sure to stay well-hydrated with plenty of fluids, mainly water.  Your imaging showed no signs of trauma from your fall down the stairs.  Please follow-up with your PCP for evaluation of your syncope, although this is likely from your COVID.  You can take over-the-counter cough and cold medications for your symptoms

## 2021-03-27 ENCOUNTER — Emergency Department (INDEPENDENT_AMBULATORY_CARE_PROVIDER_SITE_OTHER)
Admission: EM | Admit: 2021-03-27 | Discharge: 2021-03-27 | Disposition: A | Discharge status: Home / Self Care | Payer: Medicaid Other | Source: Home / Self Care | Attending: Family Medicine | Admitting: Family Medicine

## 2021-03-27 ENCOUNTER — Other Ambulatory Visit: Payer: Self-pay

## 2021-03-27 ENCOUNTER — Emergency Department (INDEPENDENT_AMBULATORY_CARE_PROVIDER_SITE_OTHER): Payer: Medicaid Other

## 2021-03-27 DIAGNOSIS — S6010XA Contusion of unspecified finger with damage to nail, initial encounter: Secondary | ICD-10-CM | POA: Diagnosis not present

## 2021-03-27 DIAGNOSIS — S6701XA Crushing injury of right thumb, initial encounter: Secondary | ICD-10-CM

## 2021-03-27 MED ORDER — TRAMADOL-ACETAMINOPHEN 37.5-325 MG PO TABS: 1.0000 | ORAL_TABLET | Freq: Four times a day (QID) | ORAL | 0 refills | Status: DC | PRN

## 2021-03-27 NOTE — Discharge Instructions (Addendum)
You do not have any broken bones in your thumb.  The pain is from the blood blister underneath the nail Use ice to reduce swelling and pain Wear splint to protect area until pain is improved See your primary care doctor if you fail to improve over the next week Take ibuprofen or Aleve for moderate pain, take Ultracet if needed for severe pain Do not drive on Ultracet

## 2021-03-27 NOTE — ED Triage Notes (Signed)
Pt st two days ago on Saturday she slamed her R thumb in the car door on accident. Pt has been doing ice and heat and Advil but she is having difficulty bending her thumb and presents with ecchymosis under nail.

## 2021-03-27 NOTE — ED Provider Notes (Addendum)
[facility]    CSN: [id number] Arrival date & time: 03/27/21  1538      History   Chief Complaint Chief Complaint  Patient presents with   Finger Injury    HPI Mandy Eaton is a 26 y.o. female.   HPI  Patient is here for injury to her thumb.  She slammed her right thumb in a car door.  It was painful and swollen.  She is worried about fracture  Past Medical History:  Diagnosis Date   Anemia     There are no problems to display for this patient.   No past surgical history on file.  OB History   No obstetric history on file.      Home Medications    Prior to Admission medications   Medication Sig Start Date End Date Taking? Authorizing Provider  traMADol-acetaminophen (ULTRACET) 37.5-325 MG tablet Take 1-2 tablets by mouth every 6 (six) hours as needed. 03/27/21  Yes Eustace Moore, MD    Family History No family history on file.  Social History Social History   Tobacco Use   Smoking status: Never   Smokeless tobacco: Never  Vaping Use   Vaping Use: Never used  Substance Use Topics   Alcohol use: Not Currently   Drug use: Not Currently     Allergies   Patient has no known allergies.   Review of Systems Review of Systems See HPI  Physical Exam Triage Vital Signs ED Triage Vitals  Enc Vitals Group     BP 03/27/21 1618 94/63     Pulse Rate 03/27/21 1618 76     Resp 03/27/21 1618 18     Temp 03/27/21 1618 98.2 F (36.8 C)     Temp Source 03/27/21 1618 Oral     SpO2 03/27/21 1618 96 %     Weight 03/27/21 1616 110 lb (49.9 kg)     Height 03/27/21 1616 5\' 3"  (1.6 m)     Head Circumference --      Peak Flow --      Pain Score 03/27/21 1616 10     Pain Loc --      Pain Edu? --      Excl. in GC? --    No data found.  Updated Vital Signs BP 94/63 (BP Location: Right Arm)    Pulse 76    Temp 98.2 F (36.8 C) (Oral)    Resp 18    Ht 5\' 3"  (1.6 m)    Wt 49.9 kg    LMP 03/17/2021    SpO2 96%    BMI 19.49 kg/m      Physical  Exam Constitutional:      General: She is not in acute distress.    Appearance: Normal appearance. She is well-developed.  HENT:     Head: Normocephalic and atraumatic.  Eyes:     Conjunctiva/sclera: Conjunctivae normal.     Pupils: Pupils are equal, round, and reactive to light.  Cardiovascular:     Rate and Rhythm: Normal rate.  Pulmonary:     Effort: Pulmonary effort is normal. No respiratory distress.  Abdominal:     General: There is no distension.     Palpations: Abdomen is soft.  Musculoskeletal:        General: Normal range of motion.     Cervical back: Normal range of motion.  Skin:    General: Skin is warm and dry.     Comments: The right thumb has a  subungual hematoma that is just under 50% of the proximal nail edge.  There is swelling at the cuticle as well.  Patient has limited movement at the DIP.  Entire area is very tender.  No open wounds  Neurological:     General: No focal deficit present.     Mental Status: She is alert.  Psychiatric:        Mood and Affect: Mood normal.     UC Treatments / Results  Labs (all labs ordered are listed, but only abnormal results are displayed) Labs Reviewed - No data to display  EKG   Radiology DG Finger Thumb Right  Result Date: 03/27/2021 CLINICAL DATA:  Right thumb nail bed bruising following a crush injury 3 days ago. EXAM: RIGHT THUMB 2+V COMPARISON:  None. FINDINGS: Mild dorsal soft tissue swelling at the level of the base of the thumb nail. No fracture, dislocation or radiopaque foreign body. IMPRESSION: No fracture or radiopaque foreign body. Electronically Signed   By: Beckie Salts M.D.   On: 03/27/2021 16:59    Procedures Procedures (including critical care time)  Medications Ordered in UC Medications - No data to display  Initial Impression / Assessment and Plan / UC Course  I have reviewed the triage vital signs and the nursing notes.  Pertinent labs & imaging results that were available during my care  of the patient were reviewed by me and considered in my medical decision making (see chart for details).    I explained to patient that nothing was fractured.  I recommended a release of subungual hematoma with a hand-held cautery.  I reassured her that this is not painful.  I told her it would help her to get better faster and relieve the pain.  Patient absolutely refused to allow additional exam, palpation, or procedure Final Clinical Impressions(s) / UC Diagnoses   Final diagnoses:  Subungual hematoma of digit of hand, initial encounter     Discharge Instructions      You do not have any broken bones in your thumb.  The pain is from the blood blister underneath the nail Use ice to reduce swelling and pain Wear splint to protect area until pain is improved See your primary care doctor if you fail to improve over the next week Take ibuprofen or Aleve for moderate pain, take Ultracet if needed for severe pain Do not drive on Ultracet   ED Prescriptions     Medication Sig Dispense Auth. Provider   traMADol-acetaminophen (ULTRACET) 37.5-325 MG tablet Take 1-2 tablets by mouth every 6 (six) hours as needed. 20 tablet Eustace Moore, MD      I have reviewed the PDMP during this encounter.   Eustace Moore, MD 03/27/21 1721    Eustace Moore, MD 03/27/21 2007

## 2021-04-15 ENCOUNTER — Encounter (HOSPITAL_COMMUNITY): Payer: Self-pay | Admitting: Obstetrics & Gynecology

## 2021-04-15 ENCOUNTER — Inpatient Hospital Stay (HOSPITAL_COMMUNITY)
Admission: AD | Admit: 2021-04-15 | Discharge: 2021-04-15 | Disposition: A | Discharge status: Home / Self Care | Payer: Medicaid Other | Attending: Obstetrics & Gynecology | Admitting: Obstetrics & Gynecology

## 2021-04-15 ENCOUNTER — Other Ambulatory Visit: Payer: Self-pay

## 2021-04-15 DIAGNOSIS — Z5329 Procedure and treatment not carried out because of patient's decision for other reasons: Secondary | ICD-10-CM | POA: Diagnosis not present

## 2021-04-15 DIAGNOSIS — O26891 Other specified pregnancy related conditions, first trimester: Secondary | ICD-10-CM | POA: Diagnosis not present

## 2021-04-15 DIAGNOSIS — Z3A01 Less than 8 weeks gestation of pregnancy: Secondary | ICD-10-CM | POA: Diagnosis not present

## 2021-04-15 DIAGNOSIS — Z3201 Encounter for pregnancy test, result positive: Secondary | ICD-10-CM

## 2021-04-15 DIAGNOSIS — N898 Other specified noninflammatory disorders of vagina: Secondary | ICD-10-CM | POA: Diagnosis not present

## 2021-04-15 LAB — POCT PREGNANCY, URINE: Preg Test, Ur: POSITIVE — AB

## 2021-04-15 NOTE — MAU Note (Signed)
Pt did not return to MAU.

## 2021-04-15 NOTE — MAU Note (Signed)
Pt reports to mau with c/o brown spotting for the past 3 days.  Reports pos hpt today.  Denies pain

## 2021-04-15 NOTE — MAU Provider Note (Signed)
History     [id number]  Arrival date and time: 04/15/21 1713    Chief Complaint  Patient presents with   Vaginal Bleeding     HPI Mandy Eaton is a 26 y.o. at [redacted]w[redacted]d by LMP, who presents for vaginal spotting. Reports episode of brown spotting 3 days ago. Bleeding has not continued. Denies abdominal pain, vaginal discharge, vaginal irritation, or dysuria. No recent intercourse.    OB History     Gravida  2   Para      Term      Preterm      AB  1   Living         SAB  1   IAB      Ectopic      Multiple      Live Births              Past Medical History:  Diagnosis Date   Anemia     No past surgical history on file.  No family history on file.  Social History   Socioeconomic History   Marital status: Single    Spouse name: Not on file   Number of children: Not on file   Years of education: Not on file   Highest education level: Not on file  Occupational History   Not on file  Tobacco Use   Smoking status: Never   Smokeless tobacco: Never  Vaping Use   Vaping Use: Never used  Substance and Sexual Activity   Alcohol use: Not Currently   Drug use: Not Currently   Sexual activity: Not on file  Other Topics Concern   Not on file  Social History Narrative   Not on file   Social Determinants of Health   Financial Resource Strain: Not on file  Food Insecurity: Not on file  Transportation Needs: Not on file  Physical Activity: Not on file  Stress: Not on file  Social Connections: Not on file  Intimate Partner Violence: Not on file    No Known Allergies  No current facility-administered medications on file prior to encounter.   Current Outpatient Medications on File Prior to Encounter  Medication Sig Dispense Refill   traMADol-acetaminophen (ULTRACET) 37.5-325 MG tablet Take 1-2 tablets by mouth every 6 (six) hours as needed. 20 tablet 0     ROS Pertinent positives and negative per HPI, all others reviewed and negative  Physical  Exam   BP 116/69 (BP Location: Right Arm)    Pulse 90    Temp 98.1 F (36.7 C) (Oral)    Resp 15    Ht 5\' 3"  (1.6 m)    Wt 48.1 kg    LMP 03/19/2021    SpO2 96%    BMI 18.79 kg/m   Patient Vitals for the past 24 hrs:  BP Temp Temp src Pulse Resp SpO2 Height Weight  04/15/21 1800 116/69 98.1 F (36.7 C) Oral 90 15 96 % 5\' 3"  (1.6 m) 48.1 kg    Physical Exam Vitals and nursing note reviewed.  Constitutional:      General: She is not in acute distress.    Appearance: Normal appearance.  Eyes:     General: No scleral icterus.    Pupils: Pupils are equal, round, and reactive to light.  Pulmonary:     Effort: Pulmonary effort is normal. No respiratory distress.  Neurological:     Mental Status: She is alert.  Psychiatric:        Mood and  Affect: Mood normal.        Behavior: Behavior normal.       Labs Results for orders placed or performed during the hospital encounter of 04/15/21 (from the past 24 hour(s))  Pregnancy, urine POC     Status: Abnormal   Collection Time: 04/15/21  5:36 PM  Result Value Ref Range   Preg Test, Ur POSITIVE (A) NEGATIVE    Imaging No results found.  MAU Course  Procedures  Lab Orders         Pregnancy, urine POC    No orders of the defined types were placed in this encounter.  Imaging Orders  No imaging studies ordered today    MDM Patient presents for brown spotting for the last 3 days. Discussed plan for evaluation. Patient has young daughter with her & knows that ultrasound cannot be performed while she has her daughter. Patient states she will drop her off with her father who is 10 minutes away & return.   Review of records from Care Everywhere: ED visit 11/2018 - B positive blood type, treated for early miscarriage.  Assessment and Plan   1. Positive pregnancy test   2. Vaginal discharge during pregnancy in first trimester   3. [redacted] weeks gestation of pregnancy     - patient did not return for evaluation. No labs or imaging  completed. Had brown spotting 3 days ago. She can follow up to MAU as needed     Judeth Horn, NP 04/15/21 6:48 PM

## 2021-04-21 ENCOUNTER — Inpatient Hospital Stay (HOSPITAL_COMMUNITY)
Admission: AD | Admit: 2021-04-21 | Discharge: 2021-04-21 | Disposition: A | Discharge status: Home / Self Care | Payer: Medicaid Other | Attending: Obstetrics and Gynecology | Admitting: Obstetrics and Gynecology

## 2021-04-21 ENCOUNTER — Other Ambulatory Visit: Payer: Self-pay

## 2021-04-21 ENCOUNTER — Encounter (HOSPITAL_COMMUNITY): Payer: Self-pay | Admitting: Obstetrics and Gynecology

## 2021-04-21 ENCOUNTER — Inpatient Hospital Stay (HOSPITAL_COMMUNITY): Payer: Medicaid Other

## 2021-04-21 DIAGNOSIS — Z3A01 Less than 8 weeks gestation of pregnancy: Secondary | ICD-10-CM | POA: Diagnosis not present

## 2021-04-21 DIAGNOSIS — R12 Heartburn: Secondary | ICD-10-CM | POA: Diagnosis not present

## 2021-04-21 DIAGNOSIS — R109 Unspecified abdominal pain: Secondary | ICD-10-CM | POA: Diagnosis not present

## 2021-04-21 DIAGNOSIS — O26891 Other specified pregnancy related conditions, first trimester: Secondary | ICD-10-CM | POA: Diagnosis present

## 2021-04-21 LAB — CBC
HCT: 31.3 % — ABNORMAL LOW (ref 36.0–46.0)
Hemoglobin: 11.6 g/dL — ABNORMAL LOW (ref 12.0–15.0)
MCH: 31.2 pg (ref 26.0–34.0)
MCHC: 37.1 g/dL — ABNORMAL HIGH (ref 30.0–36.0)
MCV: 84.1 fL (ref 80.0–100.0)
Platelets: 236 10*3/uL (ref 150–400)
RBC: 3.72 MIL/uL — ABNORMAL LOW (ref 3.87–5.11)
RDW: 13.2 % (ref 11.5–15.5)
WBC: 5.8 10*3/uL (ref 4.0–10.5)
nRBC: 0 % (ref 0.0–0.2)

## 2021-04-21 LAB — WET PREP, GENITAL
Clue Cells Wet Prep HPF POC: NONE SEEN
Sperm: NONE SEEN
Trich, Wet Prep: NONE SEEN
WBC, Wet Prep HPF POC: <10 (ref <10)
Yeast Wet Prep HPF POC: NONE SEEN

## 2021-04-21 LAB — HCG, QUANTITATIVE, PREGNANCY: hCG, Beta Chain, Quant, S: 4710 m[IU]/mL — ABNORMAL HIGH (ref <5)

## 2021-04-21 MED ORDER — FAMOTIDINE 20 MG PO TABS: 20.0000 mg | ORAL_TABLET | Freq: Two times a day (BID) | ORAL | 2 refills | Status: DC | PRN

## 2021-04-21 MED ORDER — VITAFOL GUMMIES 3.33-0.333-34.8 MG PO CHEW: 3.0000 | CHEWABLE_TABLET | Freq: Every day | ORAL | 10 refills | Status: AC

## 2021-04-21 MED ORDER — VITAFOL GUMMIES 3.33-0.333-34.8 MG PO CHEW: 3.0000 | CHEWABLE_TABLET | Freq: Every day | ORAL | 10 refills | Status: DC

## 2021-04-21 NOTE — MAU Note (Addendum)
Pt reports to MAU with c/o abdominal pain and diarrhea after taking a One A Day prenatal vitamin for only four days.  No VB or discharge.  Pt states that she also has acid reflux and has been having difficulty keeping food down.

## 2021-04-21 NOTE — MAU Provider Note (Signed)
Chief Complaint: Abdominal Pain   Event Date/Time   First Provider Initiated Contact with Patient 04/21/21 2118      SUBJECTIVE HPI: Mandy Eaton is a 26 y.o. G3P1011 at [redacted]w[redacted]d by LMP who presents to maternity admissions reporting diarrhea and abdominal cramping x 3 days since starting a gummy prenatal vitamin. She denies any vaginal bleeding, vomiting, or other symptoms. She does report recent acid reflux but was unsure of what was safe to take.    HPI  Past Medical History:  Diagnosis Date   Anemia    Past Surgical History:  Procedure Laterality Date   NO PAST SURGERIES     Social History   Socioeconomic History   Marital status: Single    Spouse name: Not on file   Number of children: Not on file   Years of education: Not on file   Highest education level: Not on file  Occupational History   Not on file  Tobacco Use   Smoking status: Never   Smokeless tobacco: Never  Vaping Use   Vaping Use: Never used  Substance and Sexual Activity   Alcohol use: Not Currently   Drug use: Not Currently   Sexual activity: Not on file  Other Topics Concern   Not on file  Social History Narrative   Not on file   Social Determinants of Health   Financial Resource Strain: Not on file  Food Insecurity: Not on file  Transportation Needs: Not on file  Physical Activity: Not on file  Stress: Not on file  Social Connections: Not on file  Intimate Partner Violence: Not on file   No current facility-administered medications on file prior to encounter.   Current Outpatient Medications on File Prior to Encounter  Medication Sig Dispense Refill   Prenatal Vit-Fe Fumarate-FA (PRENATAL MULTIVITAMIN) TABS tablet Take 1 tablet by mouth daily at 12 noon.     No Known Allergies  ROS:  Review of Systems  Constitutional:  Negative for chills, fatigue and fever.  Respiratory:  Negative for shortness of breath.   Cardiovascular:  Negative for chest pain.  Gastrointestinal:  Positive for  abdominal pain.  Genitourinary:  Negative for difficulty urinating, dysuria, flank pain, pelvic pain, vaginal bleeding, vaginal discharge and vaginal pain.  Neurological:  Negative for dizziness and headaches.  Psychiatric/Behavioral: Negative.      I have reviewed patient's Past Medical Hx, Surgical Hx, Family Hx, Social Hx, medications and allergies.   Physical Exam  Patient Vitals for the past 24 hrs:  BP Temp src Pulse Resp SpO2 Weight  04/21/21 2044 (!) 100/50 -- 84 -- -- --  04/21/21 2028 100/60 Oral 92 16 100 % 48.1 kg  04/21/21 2026 -- -- -- -- 100 % --   Constitutional: Well-developed, well-nourished female in no acute distress.  Cardiovascular: normal rate Respiratory: normal effort GI: Abd soft, non-tender. Pos BS x 4 MS: Extremities nontender, no edema, normal ROM Neurologic: Alert and oriented x 4.  GU: Neg CVAT.  PELVIC EXAM: Pt self swabbed for vaginal cultures   LAB RESULTS Results for orders placed or performed during the hospital encounter of 04/21/21 (from the past 24 hour(s))  CBC     Status: Abnormal   Collection Time: 04/21/21  9:05 PM  Result Value Ref Range   WBC 5.8 4.0 - 10.5 K/uL   RBC 3.72 (L) 3.87 - 5.11 MIL/uL   Hemoglobin 11.6 (L) 12.0 - 15.0 g/dL   HCT 61.5 (L) 18.3 - 43.7 %   MCV  84.1 80.0 - 100.0 fL   MCH 31.2 26.0 - 34.0 pg   MCHC 37.1 (H) 30.0 - 36.0 g/dL   RDW 35.0 09.3 - 81.8 %   Platelets 236 150 - 400 K/uL   nRBC 0.0 0.0 - 0.2 %  hCG, quantitative, pregnancy     Status: Abnormal   Collection Time: 04/21/21  9:05 PM  Result Value Ref Range   hCG, Beta Chain, Quant, S 4,710 (H) <5 mIU/mL  ABO/Rh     Status: None   Collection Time: 04/21/21  9:05 PM  Result Value Ref Range   ABO/RH(D)      B POS Performed at [facility], [address] [place], [place] [address]   Wet prep, genital     Status: None   Collection Time: 04/21/21  9:12 PM   Specimen: PATH Cytology Cervicovaginal Ancillary Only  Result Value Ref Range    Yeast Wet Prep HPF POC NONE SEEN NONE SEEN   Trich, Wet Prep NONE SEEN NONE SEEN   Clue Cells Wet Prep HPF POC NONE SEEN NONE SEEN   WBC, Wet Prep HPF POC <10 <10   Sperm NONE SEEN     --/--/B POS Performed at [facility], [address] [place], [place] [address]  [phone])  IMAGING US OB Comp Less 14 Wks  Result Date: 04/21/2021 CLINICAL DATA:  Pain EXAM: OBSTETRIC <14 WK ULTRASOUND TECHNIQUE: Transabdominal ultrasound was performed for evaluation of the gestation as well as the maternal uterus and adnexal regions. COMPARISON:  None. FINDINGS: Intrauterine gestational sac: None Yolk sac:  Not Visualized. Embryo:  Not Visualized. Cardiac Activity: Not Visualized. Heart Rate:  bpm MSD:    mm    w     d CRL:     mm    w  d                  [place] EDC: Subchorionic hemorrhage:  None visualized. Maternal uterus/adnexae: 3.7 cm simple appearing cyst in the right ovary. Left ovary not visualized. No adnexal mass. No free fluid. Patient declined transvaginal imaging. IMPRESSION: No intrauterine pregnancy visualized. Differential considerations would include early intrauterine pregnancy too early to visualize, spontaneous abortion, or occult ectopic pregnancy. Recommend close clinical followup and serial quantitative beta HCGs and ultrasounds. Electronically Signed   By: Charlett Nose M.D.   On: 04/21/2021 21:35   DG Finger Thumb Right  Result Date: 03/27/2021 CLINICAL DATA:  Right thumb nail bed bruising following a crush injury 3 days ago. EXAM: RIGHT THUMB 2+V COMPARISON:  None. FINDINGS: Mild dorsal soft tissue swelling at the level of the base of the thumb nail. No fracture, dislocation or radiopaque foreign body. IMPRESSION: No fracture or radiopaque foreign body. Electronically Signed   By: Beckie Salts M.D.   On: 03/27/2021 16:59    MAU Management/MDM: Orders Placed This Encounter  Procedures   Wet prep, genital   US OB Comp Less 14 Wks   CBC   hCG, quantitative, pregnancy   ABO/Rh    Discharge patient    Meds ordered this encounter  Medications   DISCONTD: Prenatal Vit-Fe Phos-FA-Omega (VITAFOL GUMMIES) 3.33-0.333-34.8 MG CHEW    Sig: Chew 3 tablets by mouth daily.    Dispense:  90 tablet    Refill:  10    Order Specific Question:   Supervising Provider    Answer:   Nettie Elm L [[id number]]   DISCONTD: famotidine (PEPCID) 20 MG tablet  Sig: Take 1 tablet (20 mg total) by mouth 2 (two) times daily as needed for heartburn or indigestion.    Dispense:  30 tablet    Refill:  2    Order Specific Question:   Supervising Provider    Answer:   Alysia Penna, MICHAEL L [[id number]]   famotidine (PEPCID) 20 MG tablet    Sig: Take 1 tablet (20 mg total) by mouth 2 (two) times daily as needed for heartburn or indigestion.    Dispense:  30 tablet    Refill:  2    Order Specific Question:   Supervising Provider    Answer:   ERVIN, MICHAEL L [[id number]]   Prenatal Vit-Fe Phos-FA-Omega (VITAFOL GUMMIES) 3.33-0.333-34.8 MG CHEW    Sig: Chew 3 tablets by mouth daily.    Dispense:  90 tablet    Refill:  10    Order Specific Question:   Supervising Provider    Answer:   Nettie Elm L [[id number]]    Findings today could represent a normal early pregnancy, spontaneous abortion or ectopic pregnancy which can be life-threatening.  Ectopic precautions were given to the patient with scheduled repeat hcg at [facility] on 2/27 am to evaluate pregnancy development. Return to MAU with worsening symptoms.   Consult Dr Alysia Penna with quant hcg of 4700 and no visual IUP. Pt declined vaginal ultrasound today so IUP or ectopic are less likely to be visualized on today's imaging.  Pt without acute abdomen on exam.  Discussed with pt lower accuracy with abdominal US. Transvaginal US may be needed depending on hcg follow up.  Pt states understanding.  Rx for Vitafol gummies and for Pepcid sent to pharmacy.  ASSESSMENT 1. [redacted] weeks gestation of pregnancy   2. Abdominal pain during pregnancy in first trimester   3. Heartburn  during pregnancy in first trimester     PLAN Discharge home Allergies as of 04/21/2021   No Known Allergies      Medication List     STOP taking these medications    prenatal multivitamin Tabs tablet       TAKE these medications    famotidine 20 MG tablet Commonly known as: Pepcid Take 1 tablet (20 mg total) by mouth 2 (two) times daily as needed for heartburn or indigestion.   Vitafol Gummies 3.33-0.333-34.8 MG Chew Chew 3 tablets by mouth daily.        Follow-up Information     [facility] Follow [facility].   Specialty: Obstetrics and Gynecology Why: On Monday April 21, 2021 at 9 am as scheduled for repeat labs. Contact information: [address] [place] [address] [phone]        [facility] Follow up.   Specialty: Obstetrics and Gynecology Why: As needed for emergencies Contact information: [address] [id number] [place] [address] [phone]                Sharen Counter Certified Nurse-Midwife 04/21/2021  10:26 PM

## 2021-04-22 LAB — ABO/RH: ABO/RH(D): B POS

## 2021-04-24 ENCOUNTER — Telehealth: Payer: Self-pay

## 2021-04-24 ENCOUNTER — Other Ambulatory Visit: Payer: Self-pay

## 2021-04-24 ENCOUNTER — Inpatient Hospital Stay (HOSPITAL_COMMUNITY)
Admission: AD | Admit: 2021-04-24 | Discharge: 2021-04-24 | Disposition: A | Discharge status: Home / Self Care | Payer: Medicaid Other | Attending: Family Medicine | Admitting: Family Medicine

## 2021-04-24 ENCOUNTER — Ambulatory Visit: Payer: Medicaid Other

## 2021-04-24 ENCOUNTER — Inpatient Hospital Stay (HOSPITAL_COMMUNITY): Payer: Medicaid Other

## 2021-04-24 DIAGNOSIS — Z3A01 Less than 8 weeks gestation of pregnancy: Secondary | ICD-10-CM

## 2021-04-24 DIAGNOSIS — O209 Hemorrhage in early pregnancy, unspecified: Secondary | ICD-10-CM | POA: Diagnosis present

## 2021-04-24 DIAGNOSIS — E349 Endocrine disorder, unspecified: Secondary | ICD-10-CM

## 2021-04-24 DIAGNOSIS — O3680X0 Pregnancy with inconclusive fetal viability, not applicable or unspecified: Secondary | ICD-10-CM | POA: Insufficient documentation

## 2021-04-24 LAB — GC/CHLAMYDIA PROBE AMP (~~LOC~~) NOT AT ARMC
Chlamydia: NEGATIVE
Comment: NEGATIVE
Comment: NORMAL
Neisseria Gonorrhea: NEGATIVE

## 2021-04-24 LAB — HCG, QUANTITATIVE, PREGNANCY: hCG, Beta Chain, Quant, S: 9614 m[IU]/mL — ABNORMAL HIGH (ref <5)

## 2021-04-24 NOTE — Telephone Encounter (Signed)
Called pt to follow up on missed appt this AM for stat Beta HCG. VM left stating I am calling regarding appt today. Callback number given. MyChart message sent.

## 2021-04-24 NOTE — MAU Provider Note (Signed)
History   Chief Complaint:  Follow-up   Mandy Eaton is  26 y.o. G3P1011 Patient's last menstrual period was 03/19/2021.Marland Kitchen Patient is here for follow up of quantitative HCG and ongoing surveillance of pregnancy status. She is [redacted]w[redacted]d weeks gestation  by LMP.    Since her last visit, the patient is without new complaint. The patient reports bleeding as  none now.  She denies any pain.  General ROS:  negative  Her previous Quantitative HCG values are:   Latest Reference Range & Units 04/21/21 21:05  HCG, Beta Chain, Quant, S <5 mIU/mL 4,710 (H)  (H): Data is abnormally high   Physical Exam   Blood pressure 105/64, pulse 78, temperature 98.5 F (36.9 C), resp. rate 18, weight 47.6 kg, last menstrual period 03/19/2021.  Physical Exam Vitals and nursing note reviewed.  Constitutional:      General: She is not in acute distress.    Appearance: She is well-developed.  HENT:     Head: Normocephalic.  Eyes:     Pupils: Pupils are equal, round, and reactive to light.  Cardiovascular:     Rate and Rhythm: Normal rate and regular rhythm.  Pulmonary:     Effort: Pulmonary effort is normal. No respiratory distress.     Breath sounds: Normal breath sounds.  Abdominal:     Palpations: Abdomen is soft.     Tenderness: There is no abdominal tenderness.  Musculoskeletal:        General: Normal range of motion.     Cervical back: Normal range of motion.  Skin:    General: Skin is warm and dry.  Neurological:     Mental Status: She is alert and oriented to person, place, and time.  Psychiatric:        Behavior: Behavior normal.        Thought Content: Thought content normal.        Judgment: Judgment normal.     Labs: Results for orders placed or performed during the hospital encounter of 04/24/21 (from the past 24 hour(s))  hCG, quantitative, pregnancy   Collection Time: 04/24/21  4:33 PM  Result Value Ref Range   hCG, Beta Chain, Quant, S 9,614 (H) <5 mIU/mL    Ultrasound Studies:    US OB Comp Less 14 Wks  Result Date: 04/21/2021 CLINICAL DATA:  Pain EXAM: OBSTETRIC <14 WK ULTRASOUND TECHNIQUE: Transabdominal ultrasound was performed for evaluation of the gestation as well as the maternal uterus and adnexal regions. COMPARISON:  None. FINDINGS: Intrauterine gestational sac: None Yolk sac:  Not Visualized. Embryo:  Not Visualized. Cardiac Activity: Not Visualized. Heart Rate:  bpm MSD:    mm    w     d CRL:     mm    w  d                  [place] EDC: Subchorionic hemorrhage:  None visualized. Maternal uterus/adnexae: 3.7 cm simple appearing cyst in the right ovary. Left ovary not visualized. No adnexal mass. No free fluid. Patient declined transvaginal imaging. IMPRESSION: No intrauterine pregnancy visualized. Differential considerations would include early intrauterine pregnancy too early to visualize, spontaneous abortion, or occult ectopic pregnancy. Recommend close clinical followup and serial quantitative beta HCGs and ultrasounds. Electronically Signed   By: Charlett Nose M.D.   On: 04/21/2021 21:35   US OB Transvaginal  Result Date: 04/24/2021 CLINICAL DATA:  Vaginal bleeding EXAM: OBSTETRIC <14 WK [place] AND TRANSVAGINAL OB US TECHNIQUE: Both transabdominal and  transvaginal ultrasound examinations were performed for complete evaluation of the gestation as well as the maternal uterus, adnexal regions, and pelvic cul-de-sac. Transvaginal technique was performed to assess early pregnancy. COMPARISON:  None. FINDINGS: Intrauterine gestational sac: Single Yolk sac:  Not Visualized. Embryo:  Not Visualized. Cardiac Activity: Not Visualized. Heart Rate: NA MSD: 8.5  mm   5 w   4  d Subchorionic hemorrhage:  None visualized. Maternal uterus/adnexae: Normal appearance of the left ovary. Cyst of the right ovary measuring up to 4.3 cm, likely a dominant follicle. No free fluid in the pelvis. IMPRESSION: Probable early intrauterine gestational sac, but no yolk sac, fetal pole, or cardiac activity  yet visualized. Recommend follow-up quantitative B-HCG levels and follow-up US in 14 days to assess viability. This recommendation follows SRU consensus guidelines: Diagnostic Criteria for Nonviable Pregnancy Early in the First Trimester. Malva Limes Med 2013; 540:9811-91. Electronically Signed   By: Allegra Lai M.D.   On: 04/24/2021 19:03   DG Finger Thumb Right  Result Date: 03/27/2021 CLINICAL DATA:  Right thumb nail bed bruising following a crush injury 3 days ago. EXAM: RIGHT THUMB 2+V COMPARISON:  None. FINDINGS: Mild dorsal soft tissue swelling at the level of the base of the thumb nail. No fracture, dislocation or radiopaque foreign body. IMPRESSION: No fracture or radiopaque foreign body. Electronically Signed   By: Beckie Salts M.D.   On: 03/27/2021 16:59    Assessment:   1. Pregnancy of unknown anatomic location   2. Elevated serum hCG   3. [redacted] weeks gestation of pregnancy     Reviewed results with Dr. Adrian Blackwater- recommends repeat ultrasound in 1 week to confirm viability  Plan: -Discharge home in stable condition -Strict ectopic precautions discussed -Patient advised to follow-up with [facility] in 1 week for repeat ultrasound, order placed -Patient may return to MAU as needed or if her condition were to change or worsen  Rolm Bookbinder, CNM 04/24/2021, 7:12 PM

## 2021-04-24 NOTE — Progress Notes (Signed)
Cleone Slim CNM in Triage to discuss test results and discharge plan. D/C instructions given by CNM and pt then d/c home

## 2021-04-24 NOTE — Discharge Instructions (Signed)

## 2021-04-24 NOTE — Telephone Encounter (Signed)
Patient called front office to return call to clinical staff. States she is on the way to MAU but would like nurse to return her call. Called pt back. VM left stating I am returning call. Callback number given.

## 2021-04-24 NOTE — MAU Note (Signed)
Pt here for f/u BHCG. Denies any vag bleeding but reports increase in clear mucusy discharge. Also reports she sometime has pain under her rib/epigastric pain that comes and goes. None right now.

## 2021-05-02 ENCOUNTER — Encounter (HOSPITAL_COMMUNITY): Payer: Self-pay | Admitting: Obstetrics and Gynecology

## 2021-05-02 ENCOUNTER — Inpatient Hospital Stay (HOSPITAL_COMMUNITY)
Admission: AD | Admit: 2021-05-02 | Discharge: 2021-05-02 | Disposition: A | Discharge status: Home / Self Care | Payer: Medicaid Other | Attending: Obstetrics and Gynecology | Admitting: Obstetrics and Gynecology

## 2021-05-02 ENCOUNTER — Other Ambulatory Visit: Payer: Self-pay

## 2021-05-02 ENCOUNTER — Other Ambulatory Visit: Payer: Self-pay | Admitting: Student

## 2021-05-02 ENCOUNTER — Inpatient Hospital Stay (HOSPITAL_COMMUNITY): Payer: Medicaid Other

## 2021-05-02 DIAGNOSIS — Z3491 Encounter for supervision of normal pregnancy, unspecified, first trimester: Secondary | ICD-10-CM

## 2021-05-02 DIAGNOSIS — O219 Vomiting of pregnancy, unspecified: Secondary | ICD-10-CM | POA: Insufficient documentation

## 2021-05-02 DIAGNOSIS — Z3A01 Less than 8 weeks gestation of pregnancy: Secondary | ICD-10-CM | POA: Insufficient documentation

## 2021-05-02 DIAGNOSIS — O26891 Other specified pregnancy related conditions, first trimester: Secondary | ICD-10-CM | POA: Insufficient documentation

## 2021-05-02 DIAGNOSIS — Z09 Encounter for follow-up examination after completed treatment for conditions other than malignant neoplasm: Secondary | ICD-10-CM

## 2021-05-02 DIAGNOSIS — Z3490 Encounter for supervision of normal pregnancy, unspecified, unspecified trimester: Secondary | ICD-10-CM

## 2021-05-02 LAB — HCG, QUANTITATIVE, PREGNANCY: hCG, Beta Chain, Quant, S: 62607 m[IU]/mL — ABNORMAL HIGH (ref <5)

## 2021-05-02 MED ORDER — PROMETHAZINE HCL 12.5 MG PO TABS: 12.5000 mg | ORAL_TABLET | Freq: Four times a day (QID) | ORAL | 0 refills | Status: DC | PRN

## 2021-05-02 MED ORDER — FAMOTIDINE 20 MG PO TABS
20.0000 mg | ORAL_TABLET | Freq: Two times a day (BID) | ORAL | 0 refills | Status: DC
Start: 2021-05-02 — End: 2021-05-09

## 2021-05-02 MED ORDER — PROMETHAZINE HCL 25 MG PO TABS: 12.5000 mg | ORAL_TABLET | Freq: Once | ORAL | Status: DC

## 2021-05-02 NOTE — MAU Provider Note (Signed)
?History  ?  ? ?CSN: [id number] ? ?Arrival date and time: 05/02/21 1543 ? ? None  ?  ? ?Chief Complaint  ?Patient presents with  ? Abdominal Pain  ? Vaginal Discharge  ? ? ?Abdominal Pain ?The primary symptoms of the illness include abdominal pain and vaginal discharge (brown).  ?Vaginal Discharge ?Associated symptoms include abdominal pain.  ?Mandy Eaton is a 26 y.o. G3P1011 @[redacted]w[redacted]d  gestation who presents to MAU with c/o lower abdominal cramping and brown vaginal discharge. Patient was evaluated in MAU 04/24/2021 and had Bhcg and ultrasound. hCG was 9,614 and u/s showed 5 wk 4d IUGS, no YS or fetal pole. Patient was instructed to return to MAU for follow up ultrasound in one week.  ?OB History   ? ? Gravida  ?3  ? Para  ?1  ? Term  ?1  ? Preterm  ?   ? AB  ?1  ? Living  ?1  ?  ? ? SAB  ?1  ? IAB  ?   ? Ectopic  ?   ? Multiple  ?   ? Live Births  ?1  ?   ?  ?  ? ? ?Past Medical History:  ?Diagnosis Date  ? Anemia   ? ? ?Past Surgical History:  ?Procedure Laterality Date  ? NO PAST SURGERIES    ? ? ?No family history on file. ? ?Social History  ? ?Tobacco Use  ? Smoking status: Never  ? Smokeless tobacco: Never  ?Vaping Use  ? Vaping Use: Never used  ?Substance Use Topics  ? Alcohol use: Not Currently  ? Drug use: Not Currently  ? ? ?Allergies: No Known Allergies ? ?Medications Prior to Admission  ?Medication Sig Dispense Refill Last Dose  ? famotidine (PEPCID) 20 MG tablet Take 1 tablet (20 mg total) by mouth 2 (two) times daily as needed for heartburn or indigestion. 30 tablet 2   ? Prenatal Vit-Fe Phos-FA-Omega (VITAFOL GUMMIES) 3.33-0.333-34.8 MG CHEW Chew 3 tablets by mouth daily. 90 tablet 10   ? ? ?Review of Systems  ?Gastrointestinal:  Positive for abdominal pain.  ?Genitourinary:  Positive for vaginal discharge (brown).  ?All other systems reviewed and are negative. ?Physical Exam  ? ?Blood pressure 102/64, pulse 90, temperature 98 ?F (36.7 ?C), temperature source Oral, resp. rate (!) 96, height 5\' 3"  (1.6 m),  weight 48.8 kg, last menstrual period 03/19/2021, SpO2 96 %. ? ?Physical Exam ?Vitals and nursing note reviewed.  ?Constitutional:   ?   Appearance: She is well-developed.  ?HENT:  ?   Head: Normocephalic.  ?Cardiovascular:  ?   Rate and Rhythm: Normal rate.  ?Pulmonary:  ?   Effort: Pulmonary effort is normal.  ?Abdominal:  ?   Palpations: Abdomen is soft.  ?   Tenderness: There is abdominal tenderness (mild, lower abdomen). There is no right CVA tenderness or left CVA tenderness.  ?Genitourinary: ?   Vagina: Vaginal discharge present. No tenderness or bleeding.  ?   Cervix: Normal.  ?Skin: ?   General: Skin is warm and dry.  ?Neurological:  ?   Mental Status: She is alert.  ? ? ?MAU Course  ?Procedures ?Results for orders placed or performed during the hospital encounter of 05/02/21 (from the past 24 hour(s))  ?hCG, quantitative, pregnancy     Status: Abnormal  ? Collection Time: 05/02/21  4:25 PM  ?Result Value Ref Range  ? hCG, Beta Chain, Quant, S 62,607 (H) <5 mIU/mL  ? ?07/02/21 OB Transvaginal ? ?Result  Date: 05/02/2021 ?CLINICAL DATA:  Discharge and cramping. EXAM: OBSTETRIC <14 WK [place] AND TRANSVAGINAL OB US TECHNIQUE: Both transabdominal and transvaginal ultrasound examinations were performed for complete evaluation of the gestation as well as the maternal uterus, adnexal regions, and pelvic cul-de-sac. Transvaginal technique was performed to assess early pregnancy. COMPARISON:  None. FINDINGS: Intrauterine gestational sac: Single Yolk sac:  Visualized. Embryo:  Visualized. Cardiac Activity: Visualized. Heart Rate: 98 bpm CRL:  4.9 mm   6 w   1 d                  [place] EDC: 12/26/2010. Subchorionic hemorrhage:  None visualized. Maternal uterus/adnexae: Simple right ovarian cyst identified measuring up to 3.5 cm. Left ovary within normal limits. Small amount of free fluid in the pelvis. IMPRESSION: 1. Single live intrauterine gestation measuring 6 weeks 1 day by crown-rump length. 2. Fetal heart rate is lower limits of  normal. Consider short-term follow-up ultrasound in 2 weeks to confirm viability. 3. 3.5 cm right ovarian cyst. 4. Small amount of free fluid in the pelvis. Electronically Signed   By: Darliss Cheney M.D.   On: 05/02/2021 18:31   ? ? ?MDM ?Rise in Bhcg, viable IUP on u/s today. ? ?Assessment and Plan  ?26 y.o. G3P1011 @[redacted]w[redacted]d  gestation stable for d/c without active vaginal bleeding and viable IUP. Will plan for f/u u/s in 2 weeks as suggested by Radiologist due to lower than normal heart rate. Will treat nausea/vomiting with Phenergan. Patient to start prenatal care as planned.  ? ?[facility] ?05/02/2021, 6:08 PM  ?

## 2021-05-02 NOTE — MAU Note (Signed)
.  Mandy Eaton is a 26 y.o. at [redacted]w[redacted]d here in MAU reporting: she's here for a repeat transvaginal U/S.  States is also having mild lower abdominal cramping & scant dark brown vaginal discharge with wiping.  Denies vaginal odor. ? ?Onset of complaint: last week ?Pain score: 4 ?Vitals:  ? 05/02/21 1557  ?BP: 102/64  ?Pulse: 90  ?Resp: (!) 96  ?Temp: 98 ?F (36.7 ?C)  ?SpO2: 96%  ?   ? ?Lab orders placed from triage:   None ?

## 2021-05-09 ENCOUNTER — Inpatient Hospital Stay (HOSPITAL_COMMUNITY)
Admission: AD | Admit: 2021-05-09 | Discharge: 2021-05-09 | Disposition: A | Discharge status: Home / Self Care | Payer: Medicaid Other | Attending: Obstetrics and Gynecology | Admitting: Obstetrics and Gynecology

## 2021-05-09 ENCOUNTER — Other Ambulatory Visit: Payer: Self-pay

## 2021-05-09 ENCOUNTER — Encounter (HOSPITAL_COMMUNITY): Payer: Self-pay | Admitting: Obstetrics and Gynecology

## 2021-05-09 DIAGNOSIS — Z3A01 Less than 8 weeks gestation of pregnancy: Secondary | ICD-10-CM | POA: Insufficient documentation

## 2021-05-09 DIAGNOSIS — O219 Vomiting of pregnancy, unspecified: Secondary | ICD-10-CM | POA: Diagnosis present

## 2021-05-09 DIAGNOSIS — R197 Diarrhea, unspecified: Secondary | ICD-10-CM | POA: Insufficient documentation

## 2021-05-09 DIAGNOSIS — O26891 Other specified pregnancy related conditions, first trimester: Secondary | ICD-10-CM | POA: Insufficient documentation

## 2021-05-09 DIAGNOSIS — R519 Headache, unspecified: Secondary | ICD-10-CM | POA: Diagnosis not present

## 2021-05-09 DIAGNOSIS — R12 Heartburn: Secondary | ICD-10-CM | POA: Insufficient documentation

## 2021-05-09 DIAGNOSIS — K529 Noninfective gastroenteritis and colitis, unspecified: Secondary | ICD-10-CM

## 2021-05-09 DIAGNOSIS — O99891 Other specified diseases and conditions complicating pregnancy: Secondary | ICD-10-CM | POA: Diagnosis not present

## 2021-05-09 DIAGNOSIS — R112 Nausea with vomiting, unspecified: Secondary | ICD-10-CM

## 2021-05-09 DIAGNOSIS — R42 Dizziness and giddiness: Secondary | ICD-10-CM | POA: Insufficient documentation

## 2021-05-09 DIAGNOSIS — K219 Gastro-esophageal reflux disease without esophagitis: Secondary | ICD-10-CM

## 2021-05-09 LAB — URINALYSIS, ROUTINE W REFLEX MICROSCOPIC
Bilirubin Urine: NEGATIVE
Glucose, UA: NEGATIVE mg/dL
Hgb urine dipstick: NEGATIVE
Ketones, ur: NEGATIVE mg/dL
Leukocytes,Ua: NEGATIVE
Nitrite: NEGATIVE
Protein, ur: NEGATIVE mg/dL
Specific Gravity, Urine: 1.015 (ref 1.005–1.030)
pH: 5 (ref 5.0–8.0)

## 2021-05-09 MED ORDER — DOXYLAMINE-PYRIDOXINE 10-10 MG PO TBEC: 1.0000 | DELAYED_RELEASE_TABLET | Freq: Two times a day (BID) | ORAL | 0 refills | Status: DC

## 2021-05-09 MED ORDER — ACETAMINOPHEN 500 MG PO TABS: 1000.0000 mg | ORAL_TABLET | Freq: Once | ORAL | 0 refills | Status: DC

## 2021-05-09 MED ORDER — ACETAMINOPHEN 500 MG PO TABS: 1000.0000 mg | ORAL_TABLET | Freq: Once | ORAL | 0 refills | Status: AC

## 2021-05-09 MED ORDER — FAMOTIDINE 20 MG PO TABS: 20.0000 mg | ORAL_TABLET | Freq: Two times a day (BID) | ORAL | 0 refills | Status: DC | PRN

## 2021-05-09 MED ORDER — ONDANSETRON HCL 4 MG PO TABS: 4.0000 mg | ORAL_TABLET | Freq: Three times a day (TID) | ORAL | 0 refills | Status: DC | PRN

## 2021-05-09 MED ORDER — ONDANSETRON 4 MG PO TBDP
4.0000 mg | ORAL_TABLET | Freq: Once | ORAL | Status: AC
  Administered 2021-05-09: 4 mg via ORAL
  Filled 2021-05-09: qty 1

## 2021-05-09 MED ORDER — ACETAMINOPHEN 500 MG PO TABS
1000.0000 mg | ORAL_TABLET | Freq: Once | ORAL | Status: DC
  Filled 2021-05-09: qty 2

## 2021-05-09 MED ORDER — FAMOTIDINE 20 MG PO TABS: 20.0000 mg | ORAL_TABLET | Freq: Once | ORAL | 0 refills | Status: DC

## 2021-05-09 MED ORDER — LACTATED RINGERS IV BOLUS
1000.0000 mL | Freq: Once | INTRAVENOUS | Status: AC
  Administered 2021-05-09: 1000 mL via INTRAVENOUS

## 2021-05-09 MED ORDER — FAMOTIDINE 20 MG PO TABS
20.0000 mg | ORAL_TABLET | Freq: Once | ORAL | Status: AC
  Administered 2021-05-09: 20 mg via ORAL
  Filled 2021-05-09: qty 1

## 2021-05-09 NOTE — Progress Notes (Signed)
Opened in error. See MAU provider note ?

## 2021-05-09 NOTE — Discharge Instructions (Signed)
You came to the MAU because you had nausea and vomiting and diarrhea. We think your nausea and vomiting is related to your early pregnancy and we gave you medications and IV fluids which helped you feel better. We also think you may have a viral stomach bug that is making some of your symptoms worse and causing diarrhea. We discussed eating small portions of food multiple times a day instead of three big meals.  ? ?You can use the following medications for your nausea ?Diclegis twice a day every day for atleast the next week and then you can go to once a day ?Zofran as needed for nausea ?Pepcid as needed for heartburn ?

## 2021-05-09 NOTE — MAU Note (Addendum)
...  Mandy Eaton is a 26 y.o. at [redacted]w[redacted]d here in MAU reporting: N/V her entire pregnancy. She states she is unable to keep anything down. Experiences dizziness after throwing up. She has not taken any of her prescribed medications as she was unaware of where they were sent. Denies pain. Denies VB or abnormal discharge.  ? ?Lab orders placed from triage:  UA ? ?

## 2021-05-09 NOTE — Progress Notes (Signed)
Written and verbal d/c instructions given and understanding voiced. 

## 2021-05-09 NOTE — MAU Provider Note (Addendum)
?History  ?  ? ?CSN: [id number] ? ?Arrival date and time: 05/09/21 1746 ? ? None  ?  ? ?Chief Complaint  ?Patient presents with  ? Nausea  ? Emesis  ? ?Mandy Eaton is a 26 year old G77P1011 female at [redacted]w[redacted]d pregnant who presents with few weeks of nausea and vomiting. She states she's been nauseated since she found out she is pregnant, which was at 4 weeks. She states she has been able to keep food and water down in the past, but she is unable to keep anything down for the last two days. She states she is also dizzy today. She denies any pain. She tried taking Tums at home with no relief. Activity worsens her symptoms. She denies sick contacts. ? ?She also reports 2-3 episodes of loose stools today.  ? ?Emesis  ?Associated symptoms include diarrhea, dizziness and headaches. Pertinent negatives include no abdominal pain, chest pain, chills or fever.  ? ?OB History   ? ? Gravida  ?3  ? Para  ?1  ? Term  ?1  ? Preterm  ?   ? AB  ?1  ? Living  ?1  ?  ? ? SAB  ?1  ? IAB  ?   ? Ectopic  ?   ? Multiple  ?   ? Live Births  ?1  ?   ?  ?  ? ? ?Past Medical History:  ?Diagnosis Date  ? Anemia   ? ? ?Past Surgical History:  ?Procedure Laterality Date  ? NO PAST SURGERIES    ? ? ?No family history on file. ? ?Social History  ? ?Tobacco Use  ? Smoking status: Never  ? Smokeless tobacco: Never  ?Vaping Use  ? Vaping Use: Never used  ?Substance Use Topics  ? Alcohol use: Not Currently  ? Drug use: Not Currently  ? ? ?Allergies: No Known Allergies ? ?Medications Prior to Admission  ?Medication Sig Dispense Refill Last Dose  ? famotidine (PEPCID) 20 MG tablet Take 1 tablet (20 mg total) by mouth 2 (two) times daily as needed for heartburn or indigestion. 30 tablet 2   ? famotidine (PEPCID) 20 MG tablet Take 1 tablet (20 mg total) by mouth 2 (two) times daily. 30 tablet 0   ? Prenatal Vit-Fe Phos-FA-Omega (VITAFOL GUMMIES) 3.33-0.333-34.8 MG CHEW Chew 3 tablets by mouth daily. 90 tablet 10   ? promethazine (PHENERGAN) 12.5 MG tablet Take 1  tablet (12.5 mg total) by mouth every 6 (six) hours as needed for nausea or vomiting. 30 tablet 0   ? ? ?Review of Systems  ?Constitutional:  Positive for appetite change. Negative for chills and fever.  ?Respiratory:  Negative for chest tightness and shortness of breath.   ?Cardiovascular:  Negative for chest pain.  ?Gastrointestinal:  Positive for diarrhea, nausea and vomiting. Negative for abdominal distention and abdominal pain.  ?Genitourinary:  Negative for difficulty urinating, dysuria and frequency.  ?Neurological:  Positive for dizziness and headaches. Negative for syncope and weakness.  ?Physical Exam  ? ?Blood pressure (!) 102/56, pulse 77, temperature 98.4 ?F (36.9 ?C), temperature source Oral, resp. rate 17, height 5\' 3"  (1.6 m), weight 48.9 kg, last menstrual period 03/19/2021, SpO2 100 %. ? ?Physical Exam ?Constitutional:   ?   Appearance: Normal appearance.  ?HENT:  ?   Head: Normocephalic and atraumatic.  ?   Right Ear: External ear normal.  ?   Left Ear: External ear normal.  ?   Nose: Nose normal.  ?  Mouth/Throat:  ?   Mouth: Mucous membranes are moist.  ?Eyes:  ?   General:     ?   Right eye: No discharge.     ?   Left eye: No discharge.  ?Cardiovascular:  ?   Rate and Rhythm: Normal rate and regular rhythm.  ?   Pulses: Normal pulses.  ?   Heart sounds: Normal heart sounds. No murmur heard. ?  No friction rub. No gallop.  ?Pulmonary:  ?   Effort: Pulmonary effort is normal. No respiratory distress.  ?   Breath sounds: Normal breath sounds. No stridor. No wheezing, rhonchi or rales.  ?Abdominal:  ?   General: There is no distension.  ?   Palpations: Abdomen is soft.  ?   Tenderness: There is no abdominal tenderness. There is no guarding.  ?Musculoskeletal:  ?   Cervical back: Normal range of motion.  ?Skin: ?   General: Skin is warm and dry.  ?Neurological:  ?   General: No focal deficit present.  ?   Mental Status: She is alert and oriented to person, place, and time. Mental status is at  baseline.  ?Psychiatric:     ?   Mood and Affect: Mood normal.     ?   Behavior: Behavior normal.  ? ? ?MAU Course  ?Procedures ? ?MDM ?26 yo G2P1011 at [redacted]w[redacted]d who presents with nausea and vomiting of first trimester with acute worsening as well as new onset diarrhea. Patient reports vomited about 3-5 times today and filled emesis bag to 200-300 cc mark each time. Also reports dizziness. Likely N/V of pregnancy pontentialy confounded with viral gastroenteritis (diarrhea). Treat with IV fluids, Zofran, and Pepcid and plan for sending meds for emesis/nausea to patient's pharmacy ? ? ?Assessment and Plan  ?Nausea & Vomiting ?-Urinalysis shows no sign of infection or other abnormalities. ?-Zofran for treatment of nausea and vomiting. ?-Tylenol for treatment of headache. ? ?10:40 PM ?Improved after treatment with IV fluids, zofran and tylenol. Patient reports no longer dizzy and nausea with significant improvement.  ?- Sent Diclegis BID to pharmacy as well as small number of Zofran pills for PRN use  ?- sent Famotidine per patient request for heart burn ?- sent Tylenol PRN  ? ?Discussed return precautions in detail including if worsening emesis/hyperemesis and unable to keep foods down ? ?Laqueta Due ?05/09/2021, 7:51 PM  ? ?GME ATTESTATION:  ?I saw and evaluated the patient. I agree with the findings and the plan of care as documented in the student?s note. ? ?Warner Mccreedy, MD, MPH ?OB Fellow, Faculty Practice ? ?

## 2021-05-15 ENCOUNTER — Telehealth: Payer: Self-pay

## 2021-05-15 ENCOUNTER — Encounter (HOSPITAL_COMMUNITY): Payer: Self-pay | Admitting: Obstetrics & Gynecology

## 2021-05-15 ENCOUNTER — Inpatient Hospital Stay (HOSPITAL_COMMUNITY)
Admission: AD | Admit: 2021-05-15 | Discharge: 2021-05-15 | Disposition: A | Discharge status: Home / Self Care | Payer: Medicaid Other | Attending: Obstetrics & Gynecology | Admitting: Obstetrics & Gynecology

## 2021-05-15 ENCOUNTER — Other Ambulatory Visit: Payer: Self-pay

## 2021-05-15 DIAGNOSIS — Z3A08 8 weeks gestation of pregnancy: Secondary | ICD-10-CM

## 2021-05-15 DIAGNOSIS — O219 Vomiting of pregnancy, unspecified: Secondary | ICD-10-CM | POA: Diagnosis present

## 2021-05-15 LAB — BASIC METABOLIC PANEL
Anion gap: 12 (ref 5–15)
BUN: 13 mg/dL (ref 6–20)
CO2: 21 mmol/L — ABNORMAL LOW (ref 22–32)
Calcium: 9.3 mg/dL (ref 8.9–10.3)
Chloride: 101 mmol/L (ref 98–111)
Creatinine, Ser: 0.61 mg/dL (ref 0.44–1.00)
GFR, Estimated: >60 mL/min (ref >60)
Glucose, Bld: 77 mg/dL (ref 70–99)
Potassium: 3.6 mmol/L (ref 3.5–5.1)
Sodium: 134 mmol/L — ABNORMAL LOW (ref 135–145)

## 2021-05-15 LAB — CBC
HCT: 36 % (ref 36.0–46.0)
Hemoglobin: 13.3 g/dL (ref 12.0–15.0)
MCH: 30.6 pg (ref 26.0–34.0)
MCHC: 36.9 g/dL — ABNORMAL HIGH (ref 30.0–36.0)
MCV: 82.8 fL (ref 80.0–100.0)
Platelets: 268 10*3/uL (ref 150–400)
RBC: 4.35 MIL/uL (ref 3.87–5.11)
RDW: 12.7 % (ref 11.5–15.5)
WBC: 8.1 10*3/uL (ref 4.0–10.5)
nRBC: 0 % (ref 0.0–0.2)

## 2021-05-15 MED ORDER — LACTATED RINGERS IV BOLUS
1000.0000 mL | Freq: Once | INTRAVENOUS | Status: AC
Start: 2021-05-15 — End: 2021-05-15
  Administered 2021-05-15: 1000 mL via INTRAVENOUS

## 2021-05-15 MED ORDER — SCOPOLAMINE 1 MG/3DAYS TD PT72
1.0000 | MEDICATED_PATCH | TRANSDERMAL | Status: DC
  Administered 2021-05-15: 1.5 mg via TRANSDERMAL
  Filled 2021-05-15: qty 1

## 2021-05-15 MED ORDER — PROMETHAZINE HCL 25 MG RE SUPP: 25.0000 mg | Freq: Four times a day (QID) | RECTAL | 0 refills | Status: DC | PRN

## 2021-05-15 MED ORDER — METOCLOPRAMIDE HCL 5 MG/ML IJ SOLN
10.0000 mg | Freq: Once | INTRAMUSCULAR | Status: AC
Start: 2021-05-15 — End: 2021-05-15
  Administered 2021-05-15: 10 mg via INTRAVENOUS
  Filled 2021-05-15: qty 2

## 2021-05-15 MED ORDER — FAMOTIDINE IN NACL 20-0.9 MG/50ML-% IV SOLN
20.0000 mg | Freq: Once | INTRAVENOUS | Status: AC
  Administered 2021-05-15: 20 mg via INTRAVENOUS
  Filled 2021-05-15: qty 50

## 2021-05-15 MED ORDER — LACTATED RINGERS IV BOLUS
1000.0000 mL | Freq: Once | INTRAVENOUS | Status: AC
  Administered 2021-05-15: 1000 mL via INTRAVENOUS

## 2021-05-15 NOTE — MAU Note (Signed)
unable to void at this time 

## 2021-05-15 NOTE — Telephone Encounter (Signed)
Call received from [facility] stating pt was seen for ultrasound today. Patient brought [place] image from prior US showing embryo. [place] today shows only yolk sac. Requesting referral for follow up US. Per chart review, pt is at MAU being evaluated now. Report given to RN at [facility], who reviews with Camelia Eng, CNM. Pt to return for follow up US on 05/17/21. Call placed to [facility] to confirm patient has been seen. ?

## 2021-05-15 NOTE — MAU Provider Note (Signed)
?History  ?  ? ?CSN: [id number] ? ?Arrival date and time: 05/15/21 1611 ? ? None  ?  ?Chief Complaint  ?Patient presents with  ? Emesis  ? ?HPI ?Mandy Eaton is a 26 y.o. G3P1011 at [redacted]w[redacted]d by LMP who presents to MAU for nausea and vomiting. She reports she has been unable to keep any fluid or foods down. Symptoms started about 1 week ago. She was given Zofran and Pepcid, both of which she has not taken in 1 week because "I can't keep it down". In the last 24 hours she reports 4-5 episodes of vomiting. She also reports some burning in her chest. She tried to eat some wheat bread earlier, but was unable to keep it down. She denies abdominal pain, bleeding, or discharge. Has [place] scheduled on 3/22. ? ? ?OB History   ? ? Gravida  ?3  ? Para  ?1  ? Term  ?1  ? Preterm  ?   ? AB  ?1  ? Living  ?1  ?  ? ? SAB  ?1  ? IAB  ?   ? Ectopic  ?   ? Multiple  ?   ? Live Births  ?1  ?   ?  ?  ? ? ?Past Medical History:  ?Diagnosis Date  ? Anemia   ? ? ?Past Surgical History:  ?Procedure Laterality Date  ? NO PAST SURGERIES    ? ? ?History reviewed. No pertinent family history. ? ?Social History  ? ?Tobacco Use  ? Smoking status: Never  ? Smokeless tobacco: Never  ?Vaping Use  ? Vaping Use: Never used  ?Substance Use Topics  ? Alcohol use: Not Currently  ? Drug use: Not Currently  ? ? ?Allergies: No Known Allergies ? ?Medications Prior to Admission  ?Medication Sig Dispense Refill Last Dose  ? Doxylamine-Pyridoxine (DICLEGIS) 10-10 MG TBEC Take 1 tablet by mouth 2 (two) times daily. 60 tablet 0 Past Week  ? famotidine (PEPCID) 20 MG tablet Take 1 tablet (20 mg total) by mouth 2 (two) times daily as needed for heartburn or indigestion. 30 tablet 0 Past Week  ? ondansetron (ZOFRAN) 4 MG tablet Take 1 tablet (4 mg total) by mouth every 8 (eight) hours as needed for nausea or vomiting. 10 tablet 0 Past Week  ? Prenatal Vit-Fe Phos-FA-Omega (VITAFOL GUMMIES) 3.33-0.333-34.8 MG CHEW Chew 3 tablets by mouth daily. 90 tablet 10 Past Week  ? calcium  carbonate (TUMS - DOSED IN MG ELEMENTAL CALCIUM) 500 MG chewable tablet Chew 1 tablet by mouth daily.     ? famotidine (PEPCID) 20 MG tablet Take 1 tablet (20 mg total) by mouth once for 1 dose. 1 tablet 0   ? ?Review of Systems  ?Constitutional: Negative.   ?Respiratory: Negative.    ?Cardiovascular: Negative.   ?Gastrointestinal:  Positive for nausea and vomiting.  ?     Heartburn ?  ?Genitourinary: Negative.   ?Musculoskeletal: Negative.   ?Neurological: Negative.   ?Physical Exam  ? ?Blood pressure 103/68, pulse 79, temperature 98.9 ?F (37.2 ?C), temperature source Oral, resp. rate 16, height 5\' 3"  (1.6 m), weight 46.9 kg, last menstrual period 03/19/2021, SpO2 100 %. ? ?Physical Exam ?Vitals and nursing note reviewed.  ?Constitutional:   ?   General: She is not in acute distress. ?Eyes:  ?   Extraocular Movements: Extraocular movements intact.  ?   Pupils: Pupils are equal, round, and reactive to light.  ?Cardiovascular:  ?   Rate and Rhythm:  Normal rate.  ?Pulmonary:  ?   Effort: Pulmonary effort is normal.  ?Abdominal:  ?   General: Abdomen is flat.  ?   Palpations: Abdomen is soft.  ?   Tenderness: There is no abdominal tenderness.  ?Musculoskeletal:     ?   General: Normal range of motion.  ?   Cervical back: Normal range of motion.  ?Skin: ?   General: Skin is warm and dry.  ?Neurological:  ?   General: No focal deficit present.  ?   Mental Status: She is alert and oriented to person, place, and time.  ?Psychiatric:     ?   Mood and Affect: Mood normal.     ?   Behavior: Behavior normal.     ?   Thought Content: Thought content normal.     ?   Judgment: Judgment normal.  ? ? ?MAU Course  ?Procedures ? ?MDM ?Labs unremarkable. Patient given 2L LR bolus, Reglan and Pepcid IV. Scopolamine patch was also applied. Patient reports nausea has improved, no episodes of emesis after medication. Patient tolerated ice chips.  ? ?Assessment and Plan  ?[redacted] weeks gestation of pregnancy ?Nausea and vomiting in  pregnancy ? ?- Discharge home in stable condition ?- Rx for phenergan suppository sent to pharmacy ?- Strict return precautions reviewed ?- Return to MAU sooner or as needed for worsening symptoms ?- Keep ultrasound appointment as scheduled on 3/22 ?- Establish prenatal care with office of choosing as soon as possible ? ? ? ?Brand Males, CNM ?05/15/2021, 11:05 PM  ?

## 2021-05-15 NOTE — MAU Note (Signed)
Mandy Eaton is a 26 y.o. at [redacted]w[redacted]d here in MAU reporting: "can't hold nothing down". Been for like 4 days now. Just been vomiting all day No pain or bleeding ? ?Onset of complaint: 4 days ?Pain score: none ?There were no vitals filed for this visit.   ? ?Lab orders placed from triage:  urine ?

## 2021-05-17 ENCOUNTER — Ambulatory Visit
Admission: RE | Admit: 2021-05-17 | Discharge: 2021-05-17 | Disposition: A | Discharge status: Home / Self Care | Payer: Medicaid Other | Source: Ambulatory Visit | Attending: Student | Admitting: Student

## 2021-05-17 ENCOUNTER — Ambulatory Visit (INDEPENDENT_AMBULATORY_CARE_PROVIDER_SITE_OTHER): Payer: Medicaid Other | Admitting: Obstetrics and Gynecology

## 2021-05-17 ENCOUNTER — Other Ambulatory Visit: Payer: Self-pay

## 2021-05-17 ENCOUNTER — Other Ambulatory Visit (HOSPITAL_COMMUNITY): Payer: Self-pay | Admitting: Student

## 2021-05-17 DIAGNOSIS — O3680X0 Pregnancy with inconclusive fetal viability, not applicable or unspecified: Secondary | ICD-10-CM | POA: Insufficient documentation

## 2021-05-17 DIAGNOSIS — Z3490 Encounter for supervision of normal pregnancy, unspecified, unspecified trimester: Secondary | ICD-10-CM

## 2021-05-17 DIAGNOSIS — O021 Missed abortion: Secondary | ICD-10-CM

## 2021-05-17 DIAGNOSIS — O039 Complete or unspecified spontaneous abortion without complication: Secondary | ICD-10-CM | POA: Insufficient documentation

## 2021-05-17 DIAGNOSIS — O0930 Supervision of pregnancy with insufficient antenatal care, unspecified trimester: Secondary | ICD-10-CM | POA: Insufficient documentation

## 2021-05-17 DIAGNOSIS — Z3A01 Less than 8 weeks gestation of pregnancy: Secondary | ICD-10-CM | POA: Insufficient documentation

## 2021-05-17 NOTE — Progress Notes (Signed)
?  CC: ultrasound follow up ?Subjective:  ? ? Patient ID: Mandy Eaton, female    DOB: 1995/03/28, 26 y.o.   MRN: [id number] ? ?HPI ?26 yo G3P1 seen for discussion of recent u/s in early pregnancy.  U/S revealed nof fetal heartbeat c/w missed abortion.  Previous ultrasound did show live IUP; however, there was concern due to relatively low fetal heart motion.  Discussed treatment options including expectant management,  medical management (cytotec) and surgical management with D and E.  Risks and benefits given for all.  Pt desires D and E.  Specific risks including bleeding, infection, involvement of other organs and uterine perforation were given.  She wishes to proceed, and information was sent to surgical scheduling. ? ? ?Review of Systems ? ?   ?Objective:  ? Physical Exam ?Vitals:  ? 05/17/21 1629  ?BP: 99/61  ?Pulse: 65  ? ?CLINICAL DATA:  Viability scan ?  ?EXAM: ?OBSTETRIC <14 WK [place] AND TRANSVAGINAL OB US ?  ?TECHNIQUE: ?Both transabdominal and transvaginal ultrasound examinations were ?performed for complete evaluation of the gestation as well as the ?maternal uterus, adnexal regions, and pelvic cul-de-sac. ?Transvaginal technique was performed to assess early pregnancy. ?  ?COMPARISON:  Such ultrasound 05/02/2021 ?  ?FINDINGS: ?Intrauterine gestational sac: Single ?  ?Yolk sac:  Visualized. ?  ?Embryo:  Visualized. ?  ?Cardiac Activity: Not Visualized. ?  ?Heart Rate: 0  bpm ?  ?CRL:  5.3 mm   6 w   2 d                  [place] EDC: 01/08/2022 ?  ?Subchorionic hemorrhage:  None visualized. ?  ?Maternal uterus/adnexae: Bilateral ovaries are within normal limits. ?No free fluid. ?  ?IMPRESSION: ?1. Single intrauterine gestation measuring 6 weeks 2 days by ?crown-rump length. There is abnormal interval growth and absent ?cardiac activity. Findings compatible with failed intrauterine ?pregnancy. ?  ?  ? ? ? ?   ?Assessment & Plan:  ? ?1. Missed abortion ?Pt desires definitive therapy with suction D and E.  Information  sent to surgical scheduling. ? ? ? ?Warden Fillers, MD ?Faculty Attending, Center [facility]  ?

## 2021-05-18 ENCOUNTER — Telehealth: Payer: Self-pay | Admitting: Family Medicine

## 2021-05-18 ENCOUNTER — Emergency Department (HOSPITAL_BASED_OUTPATIENT_CLINIC_OR_DEPARTMENT_OTHER)
Admission: EM | Admit: 2021-05-18 | Discharge: 2021-05-18 | Disposition: A | Discharge status: Home / Self Care | Payer: Medicaid Other | Attending: Emergency Medicine | Admitting: Emergency Medicine

## 2021-05-18 ENCOUNTER — Other Ambulatory Visit: Payer: Self-pay

## 2021-05-18 ENCOUNTER — Encounter (HOSPITAL_BASED_OUTPATIENT_CLINIC_OR_DEPARTMENT_OTHER): Payer: Self-pay

## 2021-05-18 DIAGNOSIS — R109 Unspecified abdominal pain: Secondary | ICD-10-CM | POA: Diagnosis not present

## 2021-05-18 DIAGNOSIS — O0991 Supervision of high risk pregnancy, unspecified, first trimester: Secondary | ICD-10-CM | POA: Insufficient documentation

## 2021-05-18 DIAGNOSIS — W108XXA Fall (on) (from) other stairs and steps, initial encounter: Secondary | ICD-10-CM | POA: Diagnosis not present

## 2021-05-18 DIAGNOSIS — Y92009 Unspecified place in unspecified non-institutional (private) residence as the place of occurrence of the external cause: Secondary | ICD-10-CM | POA: Insufficient documentation

## 2021-05-18 DIAGNOSIS — O0289 Other abnormal products of conception: Secondary | ICD-10-CM | POA: Diagnosis not present

## 2021-05-18 DIAGNOSIS — O9A211 Injury, poisoning and certain other consequences of external causes complicating pregnancy, first trimester: Secondary | ICD-10-CM | POA: Insufficient documentation

## 2021-05-18 DIAGNOSIS — M545 Low back pain, unspecified: Secondary | ICD-10-CM | POA: Diagnosis not present

## 2021-05-18 DIAGNOSIS — W19XXXA Unspecified fall, initial encounter: Secondary | ICD-10-CM

## 2021-05-18 DIAGNOSIS — Z3A01 Less than 8 weeks gestation of pregnancy: Secondary | ICD-10-CM | POA: Diagnosis not present

## 2021-05-18 LAB — HCG, SERUM, QUALITATIVE: Preg, Serum: POSITIVE — AB

## 2021-05-18 NOTE — Telephone Encounter (Signed)
Patient said she was told she was was having a miscarriage but state she is not bleeding just slightly pink, patient would like a call back.  ?

## 2021-05-18 NOTE — ED Notes (Addendum)
Delay in patient discharge because they had several questions/ concerns. Pt boyfriend became upset because when he did not understand why the MD did not check on the baby. Pt and boyfriend educated on the findings of the [place] that was done yesterday and that she needs to follow up with OBGYN. Pt and boyfriend refused to believe the results from yesterday and was requesting more answers to why more was not done. This RN informed them that she will have the MD to come and talk to them in more detail.  ? ?MD made aware and stated he will speak to them. ?

## 2021-05-18 NOTE — ED Notes (Signed)
As instructed by Trifan, pt blood was drawn, and okay for discharge. Pt ambulatory for d/c, aware she can check her results on my chart and follow up as instructed with her provider.  ?

## 2021-05-18 NOTE — Discharge Instructions (Addendum)
The cramping you are experiencing in your abdomen may be the start of a miscarriage, or spontaneous abortion.  You may begin having some vaginal bleeding and passing clots. ? ?You can contact the [facility] for [employer] [facility] at [phone] at 8 AM tomorrow (M-F) to request another OBGYN provider. ?

## 2021-05-18 NOTE — ED Notes (Signed)
Report received and care resumed.

## 2021-05-18 NOTE — ED Provider Notes (Signed)
?[facility] EMERGENCY DEPT ?Provider Note ? ? ?CSN: [id number] ?Arrival date & time: 05/18/21  1929 ? ?  ? ?History ? ?Chief Complaint  ?Patient presents with  ? Fall  ? ? ?Mandy Eaton is a 26 y.o. female presenting to the emergency department with a mechanical fall and back pain and abdominal cramping.  The patient is approximately 6 to [redacted] weeks gestation by recent ultrasound imaging.  She reports that she slipped on a toy today and fell down about 3 steps and landed on her back.  She is having lower back pain, but also began having some abdominal cramping afterwards.  She denies any vaginal bleeding.  She does have a history of prior miscarriage. ? ?She expresses some frustration about confusion regarding her pregnancy dating from her OB/GYN evaluations.  She did have a pelvic ultrasound performed yesterday, which noted a gestational age of [redacted] weeks and 2 days, which was essentially unchanged from her ultrasound 2 weeks prior.  There was concern about absent fetal beat on imaging yesterday, and clinically this may represent fetal demise per radiologist review. The patient reports she does not want to stay with her current OB physician but was requesting a different OB provider. ? ?HPI ? ?  ? ?Home Medications ?Prior to Admission medications   ?Medication Sig Start Date End Date Taking? Authorizing Provider  ?calcium carbonate (TUMS - DOSED IN MG ELEMENTAL CALCIUM) 500 MG chewable tablet Chew 1 tablet by mouth daily.    [provider]  ?Doxylamine-Pyridoxine (DICLEGIS) 10-10 MG TBEC Take 1 tablet by mouth 2 (two) times daily. 05/09/21   Warner Mccreedyas, Anuka, MD  ?famotidine (PEPCID) 20 MG tablet Take 1 tablet (20 mg total) by mouth once for 1 dose. 05/09/21 05/09/21  Warner Mccreedyas, Anuka, MD  ?famotidine (PEPCID) 20 MG tablet Take 1 tablet (20 mg total) by mouth 2 (two) times daily as needed for heartburn or indigestion. 05/09/21   Warner Mccreedyas, Anuka, MD  ?ondansetron (ZOFRAN) 4 MG tablet Take 1 tablet (4 mg total) by mouth  every 8 (eight) hours as needed for nausea or vomiting. 05/09/21   Warner Mccreedyas, Anuka, MD  ?Prenatal Vit-Fe Phos-FA-Omega (VITAFOL GUMMIES) 3.33-0.333-34.8 MG CHEW Chew 3 tablets by mouth daily. 04/21/21   Leftwich-Kirby, Wilmer FloorLisa A, CNM  ?promethazine (PHENERGAN) 25 MG suppository Place 1 suppository (25 mg total) rectally every 6 (six) hours as needed for nausea or vomiting. 05/15/21   Brand MalesSimpson, Danielle L, CNM  ?   ? ?Allergies    ?Patient has no known allergies.   ? ?Review of Systems   ?Review of Systems ? ?Physical Exam ?Updated Vital Signs ?BP 116/77   Pulse 79   Temp 99.1 ?F (37.3 ?C) (Oral)   Resp 16   Ht 5\' 3"  (1.6 m)   Wt 48.3 kg   LMP 03/19/2021   SpO2 100%   BMI 18.86 kg/m?  ?Physical Exam ?Constitutional:   ?   General: She is not in acute distress. ?HENT:  ?   Head: Normocephalic and atraumatic.  ?Eyes:  ?   Conjunctiva/sclera: Conjunctivae normal.  ?   Pupils: Pupils are equal, round, and reactive to light.  ?Cardiovascular:  ?   Rate and Rhythm: Normal rate and regular rhythm.  ?Pulmonary:  ?   Effort: Pulmonary effort is normal. No respiratory distress.  ?Abdominal:  ?   General: There is no distension.  ?   Tenderness: There is no abdominal tenderness. There is no guarding.  ?Musculoskeletal:  ?   Comments: Paralumbar tenderness on  exam, without spinal midline tenderness  ?Skin: ?   General: Skin is warm and dry.  ?Neurological:  ?   General: No focal deficit present.  ?   Mental Status: She is alert. Mental status is at baseline.  ?Psychiatric:     ?   Mood and Affect: Mood normal.     ?   Behavior: Behavior normal.  ? ? ?ED Results / Procedures / Treatments   ?Labs ?(all labs ordered are listed, but only abnormal results are displayed) ?Labs Reviewed  ?HCG, SERUM, QUALITATIVE - Abnormal; Notable for the following components:  ?    Result Value  ? Preg, Serum POSITIVE (*)   ? All other components within normal limits  ? ? ?EKG ?None ? ?Radiology ?US OB LESS THAN 14 WEEKS WITH OB TRANSVAGINAL ? ?Result  Date: 05/17/2021 ?CLINICAL DATA:  Viability scan EXAM: OBSTETRIC <14 WK [place] AND TRANSVAGINAL OB US TECHNIQUE: Both transabdominal and transvaginal ultrasound examinations were performed for complete evaluation of the gestation as well as the maternal uterus, adnexal regions, and pelvic cul-de-sac. Transvaginal technique was performed to assess early pregnancy. COMPARISON:  Such ultrasound 05/02/2021 FINDINGS: Intrauterine gestational sac: Single Yolk sac:  Visualized. Embryo:  Visualized. Cardiac Activity: Not Visualized. Heart Rate: 0  bpm CRL:  5.3 mm   6 w   2 d                  [place] EDC: 01/08/2022 Subchorionic hemorrhage:  None visualized. Maternal uterus/adnexae: Bilateral ovaries are within normal limits. No free fluid. IMPRESSION: 1. Single intrauterine gestation measuring 6 weeks 2 days by crown-rump length. There is abnormal interval growth and absent cardiac activity. Findings compatible with failed intrauterine pregnancy. Electronically Signed   By: Darliss Cheney M.D.   On: 05/17/2021 16:07   ? ?Procedures ?Procedures  ? ? ?Medications Ordered in ED ?Medications - No data to display ? ?ED Course/ Medical Decision Making/ A&P ?  ? ?                        ?Medical Decision Making ?Amount and/or Complexity of Data Reviewed ?Labs: ordered. ? ? ?Patient is here with a low impact fall, abdominal cramping.  I have a low suspicion for fracture or acute traumatic injury from her fall, and do not believe she needs emergent traumatic imaging.  I suspect a lumbar strain, for which she can continue tylenol at home. ? ?With her abdominal cramping there is some concern that this may be the initiation of an early abortion or miscarriage. I explained to her that she may have even experienced fetal demise prior to this, given the lack of change in fetal growth and absent heart tones on her ultrasound yesterday, compared to two weeks prior.  ? ?I reviewed her external records, she was seen by Dr Donavan Foil from [facility] on [facility]/17/21 who  advised expectant management vs medical management vs D&E, which the patient initially opted for D&E, but has cancelled. ? ?She is requesting transfer to a different OB/GYN provider, and we can give her the information for the [facility] clinic. I strongly encouraged her to establish care immediately with a new provider. I explained that a missed abortion carries risk of infection, although she does not have clinical signs of this at this time. ? ?I did prep her for the possibility that she may experience a miscarriage over the next few days, with her onset of cramping. She verbalized understanding.  I believe  that she is medically stable for discharge with outpatient follow up. ? ?* ? ?Subsequently, I was called back into the room after discharging that patient as her partner was now present and wanting to talk to me.  He identified himself as the father of the pregnancy. We discussed again her recent imaging workup and the need for OB follow up. I explained that I did not feel she needed repeat ultrasound imaging emergently in the ER today, as there was unlikely to be significant change from her imaging yesterday.  ? ?Unfortunately, the conversation then became contentious, as the father expressed displeasure with the "confusion" regarding her pregnancy. I again emphasized the need of follow up with a specialist to continue monitoring the pregnancy. I explained the role of the ER in ruling out life threatening conditions, and I did not feel she was experiencing an ectopic pregnancy or septic abortion.  The father then began asking me inappropriate questions about how many miscarriages I've witnessed in my personal life, and where I went to school, and I explained this was not appropriate or relevant to the issue at hand. He was upset that I was not showing them "respect," and I apologized if that was their impression, but explained that the patient's ER workup was complete at this time. She will be discharged.   Follow up information was provided in discharge instructions. ? ? ? ? ? ? ? ?Final Clinical Impression(s) / ED Diagnoses ?Final diagnoses:  ?Fall, initial encounter  ? ? ?Rx / DC Orders ?ED Discharge Orders   ? ? None  ?

## 2021-05-18 NOTE — ED Triage Notes (Signed)
Patient here POV from Home with Fall. ? ?Patient fell approximately 2 hours ago when she slipped on a toy down 3 steps. ? ?Patient has [place] completed 3 weeks and was told she was pregnant (Unsure of gestational age). ? ?Endorses Cramping to Lower ABD and Back Pain since Fall. ? ?NAD Noted during Triage. A&Ox4. GCS 15. Ambulatory. ?

## 2021-05-19 ENCOUNTER — Ambulatory Visit (HOSPITAL_COMMUNITY)
Admission: RE | Admit: 2021-05-19 | Payer: Medicaid Other | Source: Home / Self Care | Admitting: Obstetrics & Gynecology

## 2021-05-19 ENCOUNTER — Encounter (HOSPITAL_COMMUNITY): Admission: RE | Payer: Self-pay | Source: Home / Self Care

## 2021-05-19 SURGERY — DILATION AND EVACUATION, UTERUS
Anesthesia: Choice

## 2021-05-19 NOTE — Telephone Encounter (Addendum)
Mandy Basil, MD  P [facility] Admin Pool; Annabell Howells, RN ?Pt declined surgery for missed abortion.  Desires to wait 2 weeks before re-evaluation.  ?Please schedule pt to have repeat viability scan followed by MD visit in 2 weeks to review the plan  ? ?Elgie Congo, MD  ? ? ?Returned patient's call to discuss concerns. Pt is unsure about D&E because she isn't sure this is a miscarriage. I reviewed previous US results and diagnosis of failed pregnancy. Pt does not want surgery due to infection risk. Reviewed option to wait for follow up US. [place] scheduled for 05/31/21. Pt will return for provider follow up 06/01/21. ? ? ? ?

## 2021-05-31 ENCOUNTER — Ambulatory Visit
Admission: RE | Admit: 2021-05-31 | Discharge: 2021-05-31 | Disposition: A | Discharge status: Home / Self Care | Payer: Medicaid Other | Source: Ambulatory Visit

## 2021-05-31 ENCOUNTER — Ambulatory Visit (INDEPENDENT_AMBULATORY_CARE_PROVIDER_SITE_OTHER): Payer: Medicaid Other

## 2021-05-31 VITALS — BP 99/71 | HR 83 | Temp 98.4°F

## 2021-05-31 DIAGNOSIS — E349 Endocrine disorder, unspecified: Secondary | ICD-10-CM | POA: Insufficient documentation

## 2021-05-31 DIAGNOSIS — O3680X0 Pregnancy with inconclusive fetal viability, not applicable or unspecified: Secondary | ICD-10-CM | POA: Insufficient documentation

## 2021-05-31 DIAGNOSIS — Z3A01 Less than 8 weeks gestation of pregnancy: Secondary | ICD-10-CM | POA: Diagnosis present

## 2021-05-31 DIAGNOSIS — Z712 Person consulting for explanation of examination or test findings: Secondary | ICD-10-CM

## 2021-05-31 NOTE — Progress Notes (Signed)
Here today for [place] results. Results reviewed with Anyanwu, MD who finds single intrauterine gestational sac. Provider states this is an expected finding due to previously diagnosed failed pregnancy and pt may follow up with Vergie Living, MD tomorrow. Reviewed results and provider recommendation with patient. Patient reports her stomach is growing larger and complains of continued nausea. Pt and partner both express concerns that this may not be failed pregnancy. Reviewed prior US results and prior provider recommendations. Pt is concerned about pregnancy not resolving and is now considering the options given to her by provider at earlier visit. Explained they may speak to provider tomorrow about their concerns. Pt reports abdominal pain 10/10. Reports some vaginal spotting. Due to abdominal pain, Anyanwu, MD recommends immediate evaluation at [facility]. Reviewed recommendation with patient. Patient verbalizes understanding.  ? Fleet Contras RN ?05/31/21 ?

## 2021-06-01 ENCOUNTER — Encounter (HOSPITAL_COMMUNITY): Payer: Self-pay | Admitting: Obstetrics & Gynecology

## 2021-06-01 ENCOUNTER — Ambulatory Visit (INDEPENDENT_AMBULATORY_CARE_PROVIDER_SITE_OTHER): Payer: Medicaid Other | Admitting: Obstetrics and Gynecology

## 2021-06-01 ENCOUNTER — Other Ambulatory Visit: Payer: Self-pay

## 2021-06-01 ENCOUNTER — Encounter: Payer: Self-pay | Admitting: Obstetrics and Gynecology

## 2021-06-01 ENCOUNTER — Telehealth: Payer: Self-pay

## 2021-06-01 VITALS — BP 102/68 | HR 96 | Wt 105.0 lb

## 2021-06-01 DIAGNOSIS — O034 Incomplete spontaneous abortion without complication: Secondary | ICD-10-CM | POA: Diagnosis not present

## 2021-06-01 NOTE — Progress Notes (Addendum)
Ms Whistler denies chest pain or shortness of breath.  ?Patient denies having any s/s of Covid in her household.  Patient denies any known exposure to Covid.  ? ?Ms Tolles does not have a PCP. ? ?I instructed Ms Saephan to shower with antibacteria soap.  Do not shave. No nail polish, artificial or acrylic nails. Wear clean clothes, brush your teeth. ?Glasses, contact lens,dentures or partials may not be worn in the OR. If you need to wear them, please bring a case for glasses, do not wear contacts or bring a case, the hospital does not have contact cases, dentures or partials will have to be removed , make sure they are clean, we will provide a denture cup to put them in. You will need some one to drive you home and a responsible person over the age of 40 to stay with you for the first 24 hours after surgery.  ?

## 2021-06-01 NOTE — Telephone Encounter (Signed)
Called patient twice. ? ?1st time answered the phone but did not say anything ? ?2nd time no answer, went straight to voicemail, left message with surgery date and time and preop instructions. Surgery scheduled for 06/05/2021 at 9:30am, arrive to the [facility] at 7:30am, no to eat or drink after midnight on Sunday, no lotion, no powder, no deodorant, no body oil, an no makeup, remove contact lenses, have someone available to drive you and pick you up. Left callback number if she had any questions ?

## 2021-06-01 NOTE — Progress Notes (Signed)
Obstetrics and Gynecology Visit ?Return Patient Evaluation ? ?Appointment Date: 06/01/2021 ? ?Primary Care Provider: Pcp, No ? ?OBGYN Clinic: [facility] ? ?Chief Complaint: blighted ovum follow up ? ?History of Present Illness:  Mandy Eaton is a 26 y.o. [id number] with above CC. Patient being followed for above CC. She was diagnosed with this and had a d&c scheduled for this on 3/24 but she cancelled and went to [facility] for 2nd opinion on 3/25 and they also confirmed that she had a blighted ovum with no embryo and MSD >44mm. Pt had repeat u/s yesterday that showed the same (see below) ? ?Interval History: Since that time, she states that she has occasional cramping and some spotting and discharge but no fevers, chills, heavy bleeding ? ?Review of Systems: as noted in the History of Present Illness. ? ? ?Medications:  ?Pearson Grippe had no medications administered during this visit. ?Current Outpatient Medications  ?Medication Sig Dispense Refill  ? famotidine (PEPCID) 20 MG tablet Take 1 tablet (20 mg total) by mouth 2 (two) times daily as needed for heartburn or indigestion. 30 tablet 0  ? calcium carbonate (TUMS - DOSED IN MG ELEMENTAL CALCIUM) 500 MG chewable tablet Chew 1 tablet by mouth daily. (Patient not taking: Reported on 06/01/2021)    ? Doxylamine-Pyridoxine (DICLEGIS) 10-10 MG TBEC Take 1 tablet by mouth 2 (two) times daily. (Patient not taking: Reported on 06/01/2021) 60 tablet 0  ? famotidine (PEPCID) 20 MG tablet Take 1 tablet (20 mg total) by mouth once for 1 dose. 1 tablet 0  ? ondansetron (ZOFRAN) 4 MG tablet Take 1 tablet (4 mg total) by mouth every 8 (eight) hours as needed for nausea or vomiting. (Patient not taking: Reported on 06/01/2021) 10 tablet 0  ? Prenatal Vit-Fe Phos-FA-Omega (VITAFOL GUMMIES) 3.33-0.333-34.8 MG CHEW Chew 3 tablets by mouth daily. (Patient not taking: Reported on 06/01/2021) 90 tablet 10  ? promethazine (PHENERGAN) 25 MG suppository Place 1  suppository (25 mg total) rectally every 6 (six) hours as needed for nausea or vomiting. (Patient not taking: Reported on 06/01/2021) 12 each 0  ? ?No current facility-administered medications for this visit.  ? ? ?Allergies: has No Known Allergies. ? ?Physical Exam:  ?BP 102/68   Pulse 96   Wt 105 lb (47.6 kg)   LMP 03/19/2021   BMI 18.60 kg/m?  Body mass index is 18.6 kg/m?. ?General appearance: Well nourished, well developed female in no acute distress.  ?Neuro/Psych:  Normal mood and affect.   ? ?Radiology: ?Narrative & Impression  ?CLINICAL DATA:  Confirm viability ?  ?EXAM: ?TRANSVAGINAL OB ULTRASOUND ?  ?TECHNIQUE: ?Transvaginal ultrasound was performed for complete evaluation of the ?gestation as well as the maternal uterus, adnexal regions, and ?pelvic cul-de-sac. ?  ?COMPARISON:  Obstetric ultrasound 05/17/2021 ?  ?FINDINGS: ?Intrauterine gestational sac: Single ?  ?Yolk sac:  Not Visualized. ?  ?Embryo:  Not Visualized. ?  ?Cardiac Activity: Not Visualized. ?  ?Heart Rate: N/a bpm ?  ?MSD: 28.4 mm   7 w   6  d ?  ?Subchorionic hemorrhage:  None visualized. ?  ?Maternal uterus/adnexae: The right ovary is normal measuring 3.2 cm ?x 1.9 cm x 3.2 cm. The left ovary is normal measuring 3.0 cm x 1.4 ?cm x 3.0 cm. ?  ?IMPRESSION: ?Intrauterine gestational sac identified measuring 28.4 mm. No yolk ?sac or embryo are identified on the current study. Findings ?consistent with failed pregnancy. ?  ?  ?Electronically Signed ?  By:  Valetta Mole M.D. ?  On: 05/31/2021 10:38  ? ?Labs: ?B POS ? ?  Latest Ref Rng & Units 05/15/2021  ?  8:28 PM 04/21/2021  ?  9:05 PM 03/03/2021  ?  9:58 AM  ?CBC  ?WBC 4.0 - 10.5 K/uL 8.1   5.8   8.5    ?Hemoglobin 12.0 - 15.0 g/dL 13.3   11.6   13.1    ?Hematocrit 36.0 - 46.0 % 36.0   31.3   36.3    ?Platelets 150 - 400 K/uL 268   236   161    ? ? ?Assessment: pt stable ? ?Plan: ? ?1. Incomplete abortion ?Images reviewed. Options d/w her and pt elected for d&c. Request sent. ED precautions  given ? ? ?RTC: post op ? ?Durene Romans MD ?Attending ?[facility] [employer] [facility]) ? ? ?

## 2021-06-03 ENCOUNTER — Encounter (HOSPITAL_COMMUNITY): Payer: Self-pay | Admitting: Obstetrics and Gynecology

## 2021-06-03 ENCOUNTER — Other Ambulatory Visit: Payer: Self-pay

## 2021-06-03 ENCOUNTER — Inpatient Hospital Stay (HOSPITAL_COMMUNITY): Payer: Medicaid Other

## 2021-06-03 ENCOUNTER — Inpatient Hospital Stay (HOSPITAL_COMMUNITY)
Admission: AD | Admit: 2021-06-03 | Discharge: 2021-06-03 | Disposition: A | Discharge status: Home / Self Care | Payer: Medicaid Other | Attending: Obstetrics and Gynecology | Admitting: Obstetrics and Gynecology

## 2021-06-03 DIAGNOSIS — Z3A10 10 weeks gestation of pregnancy: Secondary | ICD-10-CM | POA: Diagnosis not present

## 2021-06-03 DIAGNOSIS — O039 Complete or unspecified spontaneous abortion without complication: Secondary | ICD-10-CM

## 2021-06-03 DIAGNOSIS — O020 Blighted ovum and nonhydatidiform mole: Secondary | ICD-10-CM | POA: Diagnosis not present

## 2021-06-03 LAB — CBC WITH DIFFERENTIAL/PLATELET
Abs Immature Granulocytes: 0.02 10*3/uL (ref 0.00–0.07)
Basophils Absolute: 0 10*3/uL (ref 0.0–0.1)
Basophils Relative: 0 %
Eosinophils Absolute: 0.1 10*3/uL (ref 0.0–0.5)
Eosinophils Relative: 1 %
HCT: 34.3 % — ABNORMAL LOW (ref 36.0–46.0)
Hemoglobin: 12.3 g/dL (ref 12.0–15.0)
Immature Granulocytes: 0 %
Lymphocytes Relative: 18 %
Lymphs Abs: 1.4 10*3/uL (ref 0.7–4.0)
MCH: 29.6 pg (ref 26.0–34.0)
MCHC: 35.9 g/dL (ref 30.0–36.0)
MCV: 82.7 fL (ref 80.0–100.0)
Monocytes Absolute: 0.4 10*3/uL (ref 0.1–1.0)
Monocytes Relative: 5 %
Neutro Abs: 6 10*3/uL (ref 1.7–7.7)
Neutrophils Relative %: 76 %
Platelets: 253 10*3/uL (ref 150–400)
RBC: 4.15 MIL/uL (ref 3.87–5.11)
RDW: 12.7 % (ref 11.5–15.5)
WBC: 7.9 10*3/uL (ref 4.0–10.5)
nRBC: 0 % (ref 0.0–0.2)

## 2021-06-03 LAB — TYPE AND SCREEN
ABO/RH(D): B POS
Antibody Screen: NEGATIVE

## 2021-06-03 LAB — HCG, QUANTITATIVE, PREGNANCY: hCG, Beta Chain, Quant, S: 6942 m[IU]/mL — ABNORMAL HIGH (ref <5)

## 2021-06-03 MED ORDER — IBUPROFEN 800 MG PO TABS: 800.0000 mg | ORAL_TABLET | Freq: Three times a day (TID) | ORAL | 0 refills | Status: DC

## 2021-06-03 MED ORDER — LACTATED RINGERS IV BOLUS
1000.0000 mL | Freq: Once | INTRAVENOUS | Status: AC
  Administered 2021-06-03: 1000 mL via INTRAVENOUS

## 2021-06-03 MED ORDER — HYDROMORPHONE HCL 1 MG/ML IJ SOLN
1.0000 mg | Freq: Once | INTRAMUSCULAR | Status: AC
  Administered 2021-06-03: 1 mg via INTRAVENOUS
  Filled 2021-06-03: qty 1

## 2021-06-03 MED ORDER — OXYCODONE-ACETAMINOPHEN 5-325 MG PO TABS: 1.0000 | ORAL_TABLET | Freq: Four times a day (QID) | ORAL | 0 refills | Status: DC | PRN

## 2021-06-03 MED ORDER — ONDANSETRON HCL 4 MG/2ML IJ SOLN
4.0000 mg | Freq: Once | INTRAMUSCULAR | Status: AC
  Administered 2021-06-03: 4 mg via INTRAVENOUS
  Filled 2021-06-03: qty 2

## 2021-06-03 NOTE — Progress Notes (Signed)
Patient signed printed copy of AVS.  Dc'd home in good condition.  ?

## 2021-06-03 NOTE — [facility] Provider Note (Signed)
?History  ?  ? ?CSN: [id number] ? ?Arrival date and time: 06/03/21 1136 ? ? Event Date/Time  ? First Provider Initiated Contact with Patient 06/03/21 1231   ?  ? ?Chief Complaint  ?Patient presents with  ? Abdominal Pain  ? Vaginal Bleeding  ? ?HPI ?Mandy Eaton is a 26 y.o. [id number] at [redacted]w[redacted]d who presents with vaginal bleeding and abdominal pain. She reports this morning she started having 10/10 abdominal pain. She reports it comes in waves and tried tylenol with no relief. She reports she is bleeding heavily and passing clots. She has a known blighted ovum and is scheduled for D&C on 4/10. ? ?OB History   ? ? Gravida  ?5  ? Para  ?1  ? Term  ?1  ? Preterm  ?   ? AB  ?3  ? Living  ?1  ?  ? ? SAB  ?2  ? IAB  ?1  ? Ectopic  ?   ? Multiple  ?   ? Live Births  ?1  ?   ?  ?  ? ? ?Past Medical History:  ?Diagnosis Date  ? Anemia   ? patient was unaware.  ? ? ?Past Surgical History:  ?Procedure Laterality Date  ? DILATION AND CURETTAGE OF UTERUS  2017  ? 12wk elective abortion  ? ? ?History reviewed. No pertinent family history. ? ?Social History  ? ?Tobacco Use  ? Smoking status: Never  ? Smokeless tobacco: Never  ?Vaping Use  ? Vaping Use: Never used  ?Substance Use Topics  ? Alcohol use: Not Currently  ? Drug use: Not Currently  ? ? ?Allergies: No Known Allergies ? ?Medications Prior to Admission  ?Medication Sig Dispense Refill Last Dose  ? Doxylamine-Pyridoxine (DICLEGIS) 10-10 MG TBEC Take 1 tablet by mouth 2 (two) times daily. (Patient not taking: Reported on 06/01/2021) 60 tablet 0   ? famotidine (PEPCID) 20 MG tablet Take 1 tablet (20 mg total) by mouth 2 (two) times daily as needed for heartburn or indigestion. 30 tablet 0   ? ondansetron (ZOFRAN) 4 MG tablet Take 1 tablet (4 mg total) by mouth every 8 (eight) hours as needed for nausea or vomiting. (Patient not taking: Reported on 06/01/2021) 10 tablet 0   ? Prenatal Vit-Fe Phos-FA-Omega (VITAFOL GUMMIES) 3.33-0.333-34.8 MG CHEW Chew 3 tablets by mouth daily. (Patient not  taking: Reported on 06/01/2021) 90 tablet 10   ? promethazine (PHENERGAN) 25 MG suppository Place 1 suppository (25 mg total) rectally every 6 (six) hours as needed for nausea or vomiting. (Patient not taking: Reported on 06/01/2021) 12 each 0   ? ? ?Review of Systems  ?Constitutional: Negative.  Negative for fatigue and fever.  ?HENT: Negative.    ?Respiratory: Negative.  Negative for shortness of breath.   ?Cardiovascular: Negative.  Negative for chest pain.  ?Gastrointestinal:  Positive for abdominal pain. Negative for constipation, diarrhea, nausea and vomiting.  ?Genitourinary:  Positive for vaginal bleeding. Negative for dysuria and vaginal discharge.  ?Neurological: Negative.  Negative for dizziness and headaches.  ?Physical Exam  ? ?Blood pressure 103/75, pulse 98, temperature 98.1 ?F (36.7 ?C), temperature source Oral, resp. rate 18, last menstrual period 03/19/2021, SpO2 95 %. ? ?Physical Exam ?Vitals and nursing note reviewed.  ?Constitutional:   ?   General: She is not in acute distress. ?   Appearance: She is well-developed.  ?HENT:  ?   Head: Normocephalic.  ?Eyes:  ?   Pupils: Pupils are equal, round, and reactive  to light.  ?Cardiovascular:  ?   Rate and Rhythm: Normal rate and regular rhythm.  ?   Heart sounds: Normal heart sounds.  ?Pulmonary:  ?   Effort: Pulmonary effort is normal. No respiratory distress.  ?   Breath sounds: Normal breath sounds.  ?Abdominal:  ?   General: Bowel sounds are normal. There is no distension.  ?   Palpations: Abdomen is soft.  ?   Tenderness: There is no abdominal tenderness.  ?Genitourinary: ?   Comments: SSE: large amount of bright red bleeding in vault, several large clots removed, POC removed from cervix without difficulty ?Skin: ?   General: Skin is warm and dry.  ?Neurological:  ?   Mental Status: She is alert and oriented to person, place, and time.  ?Psychiatric:     ?   Mood and Affect: Mood normal.     ?   Behavior: Behavior normal.     ?   Thought Content:  Thought content normal.     ?   Judgment: Judgment normal.  ? ? ?[facility]U Course  ?Procedures ?Results for orders placed or performed during the hospital encounter of 06/03/21 (from the past 24 hour(s))  ?CBC with Differential/Platelet     Status: Abnormal  ? Collection Time: 06/03/21 12:00 PM  ?Result Value Ref Range  ? WBC 7.9 4.0 - 10.5 K/uL  ? RBC 4.15 3.87 - 5.11 MIL/uL  ? Hemoglobin 12.3 12.0 - 15.0 g/dL  ? HCT 34.3 (L) 36.0 - 46.0 %  ? MCV 82.7 80.0 - 100.0 fL  ? MCH 29.6 26.0 - 34.0 pg  ? MCHC 35.9 30.0 - 36.0 g/dL  ? RDW 12.7 11.5 - 15.5 %  ? Platelets 253 150 - 400 K/uL  ? nRBC 0.0 0.0 - 0.2 %  ? Neutrophils Relative % 76 %  ? Neutro Abs 6.0 1.7 - 7.7 K/uL  ? Lymphocytes Relative 18 %  ? Lymphs Abs 1.4 0.7 - 4.0 K/uL  ? Monocytes Relative 5 %  ? Monocytes Absolute 0.4 0.1 - 1.0 K/uL  ? Eosinophils Relative 1 %  ? Eosinophils Absolute 0.1 0.0 - 0.5 K/uL  ? Basophils Relative 0 %  ? Basophils Absolute 0.0 0.0 - 0.1 K/uL  ? Immature Granulocytes 0 %  ? Abs Immature Granulocytes 0.02 0.00 - 0.07 K/uL  ?hCG, quantitative, pregnancy     Status: Abnormal  ? Collection Time: 06/03/21 12:00 PM  ?Result Value Ref Range  ? hCG, Beta Chain, Quant, S 6,942 (H) <5 mIU/mL  ?Type and screen [facility]     Status: None  ? Collection Time: 06/03/21 12:00 PM  ?Result Value Ref Range  ? ABO/RH(D) B POS   ? Antibody Screen NEG   ? Sample Expiration    ?  06/06/2021,2359 ?Performed at [facility], [address] [place], [place] [id number] ?  ?  ?US Pelvis Complete ? ?Result Date: 06/03/2021 ?CLINICAL DATA:  Nonviable pregnancy diagnosed 3 days ago. Vaginal bleeding. EXAM: TRANSABDOMINAL ULTRASOUND OF PELVIS TECHNIQUE: Transabdominal ultrasound examination of the pelvis was performed including evaluation of the uterus, ovaries, adnexal regions, and pelvic cul-de-sac. COMPARISON:  Obstetric ultrasound 05/31/2021 and 04/27/2021. FINDINGS: Uterus Measurements: 9.3 x 5.0 x 5.3 cm = volume: 126 mL. No fibroids or  other mass visualized. Endometrium Thickness: Mild heterogeneous thickening to 9 mm. No significant echogenic or shadowing components. No significant hypervascularity on Doppler ultrasound. Right ovary Measurements: 2.7 x 1.8 x 2.6 cm = volume: 6.5  mL. Normal appearance/no adnexal mass. Left ovary Measurements: 2.1 x 1.7 x 1.9 cm = volume: 3.5 mL. Normal appearance/no adnexal mass. Other findings:  No abnormal free fluid. IMPRESSION: 1. Mildly heterogeneous endometrial thickening without focal mass or hypervascularity. No evidence of retained products of conception. 2. No adnexal mass. Electronically Signed   By: Carey Bullocks M.D.   On: 06/03/2021 15:26    ? ?MDM ?CBC with Diff ?HCG ?Type and Screen ? ?Dilaudid IV ?LR bolus ?Zofran IV ? ?US Pelvis ? ?Assessment and Plan  ? ?1. Miscarriage   ?2. [redacted] weeks gestation of pregnancy   ? ?-Discharge home in stable condition ?-Rx for percocet and ibuprofen sent to patient's pharmacy ?-Vaginal bleeding and pain precautions discussed ?-Patient advised to follow-up with OB in 1 week for repeat HCG and 2 weeks with a provider, message sent ?-Patient may return to [facility]U as needed or if her condition were to change or worsen ? ? ?Rolm Bookbinder CNM ?06/03/2021, 12:31 PM  ?

## 2021-06-03 NOTE — MAU Note (Signed)
Patient reports light pink vaginal bleeding that started yesterday after sexual intercourse.  She states around 0730 this AM she started having heavier bright red bleeding and cramping.  She has changed her maxi pads 3 times since this Am, but they weren't soaked. She has a known failed IUP perpatient and planned a D&C on Monday. ?

## 2021-06-05 ENCOUNTER — Ambulatory Visit (HOSPITAL_COMMUNITY)
Admission: RE | Admit: 2021-06-05 | Payer: Medicaid Other | Source: Home / Self Care | Admitting: Obstetrics & Gynecology

## 2021-06-05 SURGERY — DILATION AND EVACUATION, UTERUS
Anesthesia: Choice

## 2021-06-06 LAB — SURGICAL PATHOLOGY

## 2021-06-12 ENCOUNTER — Other Ambulatory Visit: Payer: Medicaid Other

## 2021-06-12 DIAGNOSIS — O039 Complete or unspecified spontaneous abortion without complication: Secondary | ICD-10-CM

## 2021-06-13 LAB — BETA HCG QUANT (REF LAB): hCG Quant: 73 m[IU]/mL

## 2021-06-26 ENCOUNTER — Ambulatory Visit (INDEPENDENT_AMBULATORY_CARE_PROVIDER_SITE_OTHER): Payer: Medicaid Other | Admitting: Obstetrics and Gynecology

## 2021-06-26 ENCOUNTER — Encounter: Payer: Self-pay | Admitting: Obstetrics and Gynecology

## 2021-06-26 VITALS — BP 100/73 | HR 76 | Wt 108.8 lb

## 2021-06-26 DIAGNOSIS — Z658 Other specified problems related to psychosocial circumstances: Secondary | ICD-10-CM | POA: Diagnosis not present

## 2021-06-26 DIAGNOSIS — O039 Complete or unspecified spontaneous abortion without complication: Secondary | ICD-10-CM | POA: Diagnosis not present
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
## 2021-06-27 ENCOUNTER — Telehealth: Payer: Self-pay

## 2021-06-27 DIAGNOSIS — O039 Complete or unspecified spontaneous abortion without complication: Secondary | ICD-10-CM

## 2021-06-27 LAB — BETA HCG QUANT (REF LAB): hCG Quant: 8 m[IU]/mL

## 2021-06-27 NOTE — Telephone Encounter (Addendum)
-----   Message from Griffin Basil, MD sent at 06/27/2021  1:13 PM EDT ----- ?Bhcg decreased, recheck in 2 weeks ? ?Left message for pt to return call back about results and f/u appt.   ? ?Taquisha Phung,RN  ?06/27/21 ?

## 2021-06-28 NOTE — Addendum Note (Signed)
Addended by: Louisa Second E on: 06/28/2021 10:14 AM ? ? Modules accepted: Orders ? ?

## 2021-06-28 NOTE — Telephone Encounter (Signed)
Called pt; results reviewed. Lab appt scheduled for 07/10/21. ?

## 2021-07-10 ENCOUNTER — Other Ambulatory Visit: Payer: Medicaid Other

## 2021-07-31 ENCOUNTER — Telehealth: Payer: Self-pay | Admitting: Clinical

## 2021-07-31 NOTE — Telephone Encounter (Signed)
Attempt to call regarding referral; Left HIPPA-compliant message to call back Wilgus Deyton from [facility] at  [phone] (Mischelle Reeg's office).   

## 2021-08-05 ENCOUNTER — Inpatient Hospital Stay (HOSPITAL_COMMUNITY): Payer: Medicaid Other

## 2021-08-05 ENCOUNTER — Encounter (HOSPITAL_COMMUNITY): Payer: Self-pay | Admitting: *Deleted

## 2021-08-05 ENCOUNTER — Inpatient Hospital Stay (HOSPITAL_COMMUNITY)
Admission: AD | Admit: 2021-08-05 | Discharge: 2021-08-05 | Disposition: A | Discharge status: Home / Self Care | Payer: Medicaid Other | Attending: Obstetrics and Gynecology | Admitting: Obstetrics and Gynecology

## 2021-08-05 DIAGNOSIS — O3680X0 Pregnancy with inconclusive fetal viability, not applicable or unspecified: Secondary | ICD-10-CM

## 2021-08-05 DIAGNOSIS — R102 Pelvic and perineal pain: Secondary | ICD-10-CM | POA: Diagnosis not present

## 2021-08-05 DIAGNOSIS — Z3201 Encounter for pregnancy test, result positive: Secondary | ICD-10-CM | POA: Insufficient documentation

## 2021-08-05 DIAGNOSIS — O26899 Other specified pregnancy related conditions, unspecified trimester: Secondary | ICD-10-CM | POA: Diagnosis not present

## 2021-08-05 DIAGNOSIS — Z87898 Personal history of other specified conditions: Secondary | ICD-10-CM

## 2021-08-05 LAB — URINALYSIS, ROUTINE W REFLEX MICROSCOPIC
Bilirubin Urine: NEGATIVE
Glucose, UA: NEGATIVE mg/dL
Hgb urine dipstick: NEGATIVE
Ketones, ur: NEGATIVE mg/dL
Leukocytes,Ua: NEGATIVE
Nitrite: NEGATIVE
Protein, ur: NEGATIVE mg/dL
Specific Gravity, Urine: 1.021 (ref 1.005–1.030)
pH: 5 (ref 5.0–8.0)

## 2021-08-05 LAB — WET PREP, GENITAL
Clue Cells Wet Prep HPF POC: NONE SEEN
Sperm: NONE SEEN
Trich, Wet Prep: NONE SEEN
WBC, Wet Prep HPF POC: <10 (ref <10)
Yeast Wet Prep HPF POC: NONE SEEN

## 2021-08-05 LAB — CBC
HCT: 33.5 % — ABNORMAL LOW (ref 36.0–46.0)
Hemoglobin: 11.7 g/dL — ABNORMAL LOW (ref 12.0–15.0)
MCH: 27.1 pg (ref 26.0–34.0)
MCHC: 34.9 g/dL (ref 30.0–36.0)
MCV: 77.7 fL — ABNORMAL LOW (ref 80.0–100.0)
Platelets: 254 10*3/uL (ref 150–400)
RBC: 4.31 MIL/uL (ref 3.87–5.11)
RDW: 14 % (ref 11.5–15.5)
WBC: 4.8 10*3/uL (ref 4.0–10.5)
nRBC: 0 % (ref 0.0–0.2)

## 2021-08-05 LAB — HCG, QUANTITATIVE, PREGNANCY: hCG, Beta Chain, Quant, S: 5428 m[IU]/mL — ABNORMAL HIGH (ref <5)

## 2021-08-05 LAB — POCT PREGNANCY, URINE: Preg Test, Ur: POSITIVE — AB

## 2021-08-05 NOTE — MAU Note (Signed)
.  Mandy Eaton is a 26 y.o. at Unknown here in MAU reporting: She had a miscarriage in April. BHCG went down to 1 beginning of May. Did not have a period in May took pregnancy test today and it was positive. C/o sharp abd pain on and off for a few days. Denies any vag bleeding or discharge but reports an vaginal irritation  when she washed with ivory soap.  LMP: unknown Onset of complaint: 2-3 days Pain score: 2 Vitals:   08/05/21 1051  BP: (!) 105/59  Pulse: 85  Resp: 18  Temp: 98.7 F (37.1 C)     FHT:n/a Lab orders placed from triage:
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
## 2021-08-07 ENCOUNTER — Other Ambulatory Visit: Payer: Self-pay

## 2021-08-07 ENCOUNTER — Encounter (HOSPITAL_COMMUNITY): Payer: Self-pay | Admitting: Family Medicine

## 2021-08-07 ENCOUNTER — Inpatient Hospital Stay (HOSPITAL_COMMUNITY)
Admission: AD | Admit: 2021-08-07 | Discharge: 2021-08-07 | Disposition: A | Discharge status: Home / Self Care | Payer: Medicaid Other | Attending: Family Medicine | Admitting: Family Medicine

## 2021-08-07 DIAGNOSIS — Z3A01 Less than 8 weeks gestation of pregnancy: Secondary | ICD-10-CM | POA: Diagnosis not present

## 2021-08-07 DIAGNOSIS — Z3201 Encounter for pregnancy test, result positive: Secondary | ICD-10-CM | POA: Diagnosis present

## 2021-08-07 LAB — HCG, QUANTITATIVE, PREGNANCY: hCG, Beta Chain, Quant, S: 8118 m[IU]/mL — ABNORMAL HIGH (ref <5)

## 2021-08-07 LAB — GC/CHLAMYDIA PROBE AMP (~~LOC~~) NOT AT ARMC
Chlamydia: NEGATIVE
Comment: NEGATIVE
Comment: NORMAL
Neisseria Gonorrhea: NEGATIVE

## 2021-08-07 NOTE — MAU Provider Note (Signed)
History   Chief Complaint:  Follow-up   Mandy Eaton is  26 y.o. [id number] Patient's last menstrual period was 03/19/2021.Marland Kitchen Patient is here for follow up of quantitative HCG and ongoing surveillance of pregnancy status. She is [redacted]w[redacted]d weeks gestation by early ultrasound.    Since her last visit, the patient is without new complaint. The patient reports bleeding as  none now.  She denies any pain.  General ROS:  negative  Her previous Quantitative HCG values are: 5,428  Physical Exam   Blood pressure 90/61, pulse 86, temperature 98.4 F (36.9 C), temperature source Oral, resp. rate 16, last menstrual period 03/19/2021, SpO2 99 %, unknown if currently breastfeeding.  Focused Gynecological Exam: examination not indicated  Labs: Results for orders placed or performed during the hospital encounter of 08/07/21 (from the past 24 hour(s))  hCG, quantitative, pregnancy   Collection Time: 08/07/21  4:04 PM  Result Value Ref Range   hCG, Beta Chain, Quant, S 8,118 (H) <5 mIU/mL    Ultrasound Studies:   US OB Transvaginal  Result Date: 08/05/2021 CLINICAL DATA:  Evaluate pregnancy of unknown location. EXAM: TRANSVAGINAL OB ULTRASOUND TECHNIQUE: Transvaginal ultrasound was performed for complete evaluation of the gestation as well as the maternal uterus, adnexal regions, and pelvic cul-de-sac. COMPARISON:  06/03/2021 FINDINGS: Intrauterine gestational sac: Single Yolk sac:  Not Visualized. Embryo:  Not Visualized. Cardiac Activity: Not Visualized. MSD: 4.5 mm   5 w   2 d Subchorionic hemorrhage:  None visualized. Maternal uterus/adnexae: Right ovary: Normal containing corpus luteal cyst Left ovary: Normal Other :Retroverted uterus. Exam detail is diminished due to uterine position and patient tenderness. Free fluid:  Trace free fluid noted in the pelvis. IMPRESSION: Probable early intrauterine gestational sac, but no yolk sac, fetal pole, or cardiac activity yet visualized. Recommend follow-up quantitative  B-HCG levels and follow-up US in 14 days to confirm and assess viability. This recommendation follows SRU consensus guidelines: Diagnostic Criteria for Nonviable Pregnancy Early in the First Trimester. [employer] Med [id number]. Electronically Signed   By: Kerby Moors M.D.   On: 08/05/2021 12:44    Assessment:   1. Less than [redacted] weeks gestation of pregnancy   2. [redacted] weeks gestation of pregnancy       Plan: -Discharge home in stable condition -List of [place] [facility] providers  -Patient advised to follow-up with OB provider of her choice -Patient may return to MAU as needed or if her condition were to change or worsen  Laury Deep, CNM 08/07/2021, 6:14 PM

## 2021-08-07 NOTE — MAU Note (Signed)
Mandy Eaton is a 26 y.o. at [redacted]w[redacted]d here in MAU reporting: here for follow up hcg. Denies pain or bleeding.  Onset of complaint: ongoing  Pain score: 0/10  Vitals:   08/07/21 1602  BP: 90/61  Pulse: 86  Resp: 16  Temp: 98.4 F (36.9 C)  SpO2: 99%     Lab orders placed from triage: hcg

## 2021-08-07 NOTE — Discharge Instructions (Signed)
[facility]   [facility]             [address], [place], [place] [address] [phone]  [facility]                                                             [address], [address], [place], [place], [address] [phone]  [facility] [place]                                    [address], [address], [place], [place], [address] [phone]  [facility] [place] [address], [address], [place], [place], [address] [phone]  [facility] [place]                                 [address], [place], [place], [address] [phone]  [facility]                                    [address], [place], [place], [address] [phone]  [facility] [address], [address], [place], [place], [address]                              [facility] [address], [place], [place], [address] [phone]  [facility]         Phone: [phone]  [facility]      Phone: [phone]   [facility]      Phone: [phone]  [facility]         Phone: [phone]   [facility]    Phone: [phone]  [facility]      Phone: [phone]  [facility]     Phone: [phone]  [facility]        Phone: [phone]  [facility]      Phone: [phone]  

## 2021-08-23 ENCOUNTER — Inpatient Hospital Stay (HOSPITAL_COMMUNITY)
Admission: AD | Admit: 2021-08-23 | Discharge: 2021-08-23 | Disposition: A | Discharge status: Home / Self Care | Payer: Medicaid Other | Attending: Obstetrics & Gynecology | Admitting: Obstetrics & Gynecology

## 2021-08-23 ENCOUNTER — Other Ambulatory Visit: Payer: Self-pay

## 2021-08-23 ENCOUNTER — Encounter (HOSPITAL_COMMUNITY): Payer: Self-pay | Admitting: Obstetrics & Gynecology

## 2021-08-23 DIAGNOSIS — R42 Dizziness and giddiness: Secondary | ICD-10-CM | POA: Diagnosis not present

## 2021-08-23 DIAGNOSIS — O99411 Diseases of the circulatory system complicating pregnancy, first trimester: Secondary | ICD-10-CM | POA: Diagnosis not present

## 2021-08-23 DIAGNOSIS — Z3A01 Less than 8 weeks gestation of pregnancy: Secondary | ICD-10-CM

## 2021-08-23 DIAGNOSIS — O219 Vomiting of pregnancy, unspecified: Secondary | ICD-10-CM

## 2021-08-23 DIAGNOSIS — O99611 Diseases of the digestive system complicating pregnancy, first trimester: Secondary | ICD-10-CM | POA: Insufficient documentation

## 2021-08-23 DIAGNOSIS — K219 Gastro-esophageal reflux disease without esophagitis: Secondary | ICD-10-CM

## 2021-08-23 LAB — COMPREHENSIVE METABOLIC PANEL
ALT: 10 U/L (ref 0–44)
AST: 17 U/L (ref 15–41)
Albumin: 3.4 g/dL — ABNORMAL LOW (ref 3.5–5.0)
Alkaline Phosphatase: 41 U/L (ref 38–126)
Anion gap: 6 (ref 5–15)
BUN: 12 mg/dL (ref 6–20)
CO2: 22 mmol/L (ref 22–32)
Calcium: 9 mg/dL (ref 8.9–10.3)
Chloride: 108 mmol/L (ref 98–111)
Creatinine, Ser: 0.64 mg/dL (ref 0.44–1.00)
GFR, Estimated: >60 mL/min (ref >60)
Glucose, Bld: 92 mg/dL (ref 70–99)
Potassium: 3.8 mmol/L (ref 3.5–5.1)
Sodium: 136 mmol/L (ref 135–145)
Total Bilirubin: 0.2 mg/dL — ABNORMAL LOW (ref 0.3–1.2)
Total Protein: 6.5 g/dL (ref 6.5–8.1)

## 2021-08-23 LAB — URINALYSIS, ROUTINE W REFLEX MICROSCOPIC
Bilirubin Urine: NEGATIVE
Glucose, UA: NEGATIVE mg/dL
Hgb urine dipstick: NEGATIVE
Ketones, ur: NEGATIVE mg/dL
Leukocytes,Ua: NEGATIVE
Nitrite: NEGATIVE
Protein, ur: NEGATIVE mg/dL
Specific Gravity, Urine: 1.026 (ref 1.005–1.030)
pH: 5 (ref 5.0–8.0)

## 2021-08-23 LAB — CBC
HCT: 31.6 % — ABNORMAL LOW (ref 36.0–46.0)
Hemoglobin: 11.4 g/dL — ABNORMAL LOW (ref 12.0–15.0)
MCH: 27.9 pg (ref 26.0–34.0)
MCHC: 36.1 g/dL — ABNORMAL HIGH (ref 30.0–36.0)
MCV: 77.5 fL — ABNORMAL LOW (ref 80.0–100.0)
Platelets: 279 10*3/uL (ref 150–400)
RBC: 4.08 MIL/uL (ref 3.87–5.11)
RDW: 14.8 % (ref 11.5–15.5)
WBC: 8 10*3/uL (ref 4.0–10.5)
nRBC: 0 % (ref 0.0–0.2)

## 2021-08-23 MED ORDER — PROMETHAZINE HCL 25 MG PO TABS
12.5000 mg | ORAL_TABLET | Freq: Once | ORAL | Status: DC
  Filled 2021-08-23: qty 1

## 2021-08-23 MED ORDER — ONDANSETRON 4 MG PO TBDP
4.0000 mg | ORAL_TABLET | Freq: Once | ORAL | Status: AC
  Administered 2021-08-23: 4 mg via ORAL
  Filled 2021-08-23: qty 1

## 2021-08-23 MED ORDER — FAMOTIDINE 20 MG PO TABS: 20.0000 mg | ORAL_TABLET | Freq: Two times a day (BID) | ORAL | 2 refills | Status: AC | PRN

## 2021-08-23 MED ORDER — FAMOTIDINE 20 MG PO TABS
20.0000 mg | ORAL_TABLET | Freq: Once | ORAL | Status: AC
  Administered 2021-08-23: 20 mg via ORAL
  Filled 2021-08-23: qty 1

## 2021-08-23 MED ORDER — METOCLOPRAMIDE HCL 10 MG PO TABS: 10.0000 mg | ORAL_TABLET | Freq: Three times a day (TID) | ORAL | 2 refills | Status: DC | PRN

## 2021-08-23 MED ORDER — PROMETHAZINE HCL 12.5 MG PO TABS: 12.5000 mg | ORAL_TABLET | Freq: Every evening | ORAL | 0 refills | Status: DC | PRN

## 2021-08-23 NOTE — MAU Note (Signed)
.  Mandy Eaton is a 25 y.o. at [redacted]w[redacted]d here in MAU reporting: N/V today with 3 episodes of emesis, and dizziness for the last two week that gotten worse. Pt reports she can not keep anything done including fluids, and she vomited  everything she ate today. Pt states she has not taken anything for the N/V. Pt reports she has not fallen or felt like she has to pass out, but constant dizziness. Pt denies VB and LOF.   Onset of complaint: 0900 Pain score: 7/10 stomach Vitals:   08/23/21 2023  BP: (!) 98/56  Resp: 16  Temp: 99.1 F (37.3 C)  SpO2: 99%      Lab orders placed from triage:   UA

## 2021-08-23 NOTE — MAU Provider Note (Signed)
Chief Complaint: Emesis and Dizziness   Event Date/Time   First Provider Initiated Contact with Patient 08/23/21 2114      SUBJECTIVE HPI: Mandy Eaton is a 26 y.o. [id number] at [redacted]w[redacted]d by early ultrasound who presents to maternity admissions reporting onset of nausea with emesis x 3 today and dizziness also starting today.  She reports she has kept down some food and fluids in between the episodes of vomiting.   She denies vaginal bleeding, vaginal itching/burning, urinary symptoms, h/a, dizziness, or fever/chills.     HPI  Past Medical History:  Diagnosis Date   Anemia    patient was unaware.   Past Surgical History:  Procedure Laterality Date   DILATION AND CURETTAGE OF UTERUS  2017   12wk elective abortion   Social History   Socioeconomic History   Marital status: Single    Spouse name: Not on file   Number of children: Not on file   Years of education: Not on file   Highest education level: Not on file  Occupational History   Not on file  Tobacco Use   Smoking status: Never   Smokeless tobacco: Never  Vaping Use   Vaping Use: Never used  Substance and Sexual Activity   Alcohol use: Not Currently   Drug use: Not Currently   Sexual activity: Not Currently  Other Topics Concern   Not on file  Social History Narrative   Not on file   Social Determinants of Health   Financial Resource Strain: Not on file  Food Insecurity: No Food Insecurity (06/26/2021)   Hunger Vital Sign    Worried About Running Out of Food in the Last Year: Never true    Ran Out of Food in the Last Year: Never true  Transportation Needs: No Transportation Needs (06/26/2021)   PRAPARE - Administrator, Civil Service (Medical): No    Lack of Transportation (Non-Medical): No  Physical Activity: Not on file  Stress: Not on file  Social Connections: Not on file  Intimate Partner Violence: Not on file   No current facility-administered medications on file prior to encounter.   Current  Outpatient Medications on File Prior to Encounter  Medication Sig Dispense Refill   Prenatal Vit-Fe Phos-FA-Omega (VITAFOL GUMMIES) 3.33-0.333-34.8 MG CHEW Chew 3 tablets by mouth daily. (Patient not taking: Reported on 06/01/2021) 90 tablet 10   promethazine (PHENERGAN) 25 MG suppository Place 1 suppository (25 mg total) rectally every 6 (six) hours as needed for nausea or vomiting. (Patient not taking: Reported on 06/01/2021) 12 each 0   No Known Allergies  ROS:  Review of Systems  Constitutional:  Negative for chills, fatigue and fever.  Respiratory:  Negative for shortness of breath.   Cardiovascular:  Negative for chest pain.  Gastrointestinal:  Positive for nausea and vomiting. Negative for abdominal pain.  Genitourinary:  Negative for difficulty urinating, dysuria, flank pain, pelvic pain, vaginal bleeding, vaginal discharge and vaginal pain.  Neurological:  Positive for dizziness. Negative for headaches.  Psychiatric/Behavioral: Negative.       I have reviewed patient's Past Medical Hx, Surgical Hx, Family Hx, Social Hx, medications and allergies.   Physical Exam  BP (!) 98/56 (BP Location: Right Arm)   Temp 99.1 F (37.3 C) (Oral)   Resp 16   Ht 5\' 3"  (1.6 m)   Wt 51.7 kg   LMP 03/19/2021   SpO2 99%   BMI 20.19 kg/m    Orthostatic VS: BP-lying 98/60, sitting 98/64, [place] 101/67  Pulse- lying 81, sitting 77, standing 92   Constitutional: Well-developed, well-nourished female in no acute distress.  Cardiovascular: normal rate Respiratory: normal effort GI: Abd soft, non-tender. Pos BS x 4 MS: Extremities nontender, no edema, normal ROM Neurologic: Alert and oriented x 4.  GU: Neg CVAT.  PELVIC EXAM:  Deferred  LAB RESULTS Results for orders placed or performed during the hospital encounter of 08/23/21 (from the past 24 hour(s))  Urinalysis, Routine w reflex microscopic     Status: None   Collection Time: 08/23/21  8:36 PM  Result Value Ref Range   Color, Urine  YELLOW YELLOW   APPearance CLEAR CLEAR   Specific Gravity, Urine 1.026 1.005 - 1.030   pH 5.0 5.0 - 8.0   Glucose, UA NEGATIVE NEGATIVE mg/dL   Hgb urine dipstick NEGATIVE NEGATIVE   Bilirubin Urine NEGATIVE NEGATIVE   Ketones, ur NEGATIVE NEGATIVE mg/dL   Protein, ur NEGATIVE NEGATIVE mg/dL   Nitrite NEGATIVE NEGATIVE   Leukocytes,Ua NEGATIVE NEGATIVE  CBC     Status: Abnormal   Collection Time: 08/23/21 10:00 PM  Result Value Ref Range   WBC 8.0 4.0 - 10.5 K/uL   RBC 4.08 3.87 - 5.11 MIL/uL   Hemoglobin 11.4 (L) 12.0 - 15.0 g/dL   HCT 03.4 (L) 74.2 - 59.5 %   MCV 77.5 (L) 80.0 - 100.0 fL   MCH 27.9 26.0 - 34.0 pg   MCHC 36.1 (H) 30.0 - 36.0 g/dL   RDW 63.8 75.6 - 43.3 %   Platelets 279 150 - 400 K/uL   nRBC 0.0 0.0 - 0.2 %  Comprehensive metabolic panel     Status: Abnormal   Collection Time: 08/23/21 10:00 PM  Result Value Ref Range   Sodium 136 135 - 145 mmol/L   Potassium 3.8 3.5 - 5.1 mmol/L   Chloride 108 98 - 111 mmol/L   CO2 22 22 - 32 mmol/L   Glucose, Bld 92 70 - 99 mg/dL   BUN 12 6 - 20 mg/dL   Creatinine, Ser 2.95 0.44 - 1.00 mg/dL   Calcium 9.0 8.9 - 18.8 mg/dL   Total Protein 6.5 6.5 - 8.1 g/dL   Albumin 3.4 (L) 3.5 - 5.0 g/dL   AST 17 15 - 41 U/L   ALT 10 0 - 44 U/L   Alkaline Phosphatase 41 38 - 126 U/L   Total Bilirubin 0.2 (L) 0.3 - 1.2 mg/dL   GFR, Estimated >41 >66 mL/min   Anion gap 6 5 - 15    --/--/B POS (04/08 1200)  IMAGING US OB Transvaginal  Result Date: 08/05/2021 CLINICAL DATA:  Evaluate pregnancy of unknown location. EXAM: TRANSVAGINAL OB ULTRASOUND TECHNIQUE: Transvaginal ultrasound was performed for complete evaluation of the gestation as well as the maternal uterus, adnexal regions, and pelvic cul-de-sac. COMPARISON:  06/03/2021 FINDINGS: Intrauterine gestational sac: Single Yolk sac:  Not Visualized. Embryo:  Not Visualized. Cardiac Activity: Not Visualized. MSD: 4.5 mm   5 w   2 d Subchorionic hemorrhage:  None visualized. Maternal  uterus/adnexae: Right ovary: Normal containing corpus luteal cyst Left ovary: Normal Other :Retroverted uterus. Exam detail is diminished due to uterine position and patient tenderness. Free fluid:  Trace free fluid noted in the pelvis. IMPRESSION: Probable early intrauterine gestational sac, but no yolk sac, fetal pole, or cardiac activity yet visualized. Recommend follow-up quantitative B-HCG levels and follow-up US in 14 days to confirm and assess viability. This recommendation follows SRU consensus guidelines: Diagnostic Criteria for Nonviable Pregnancy Early  in the First Trimester. Malva Limes Med 2013; 287:8676-72. Electronically Signed   By: Signa Kell M.D.   On: 08/05/2021 12:44    MAU Management/MDM: Orders Placed This Encounter  Procedures   Urinalysis, Routine w reflex microscopic   CBC   Comprehensive metabolic panel   Orthostatic vital signs   ED EKG   Discharge patient    Meds ordered this encounter  Medications   DISCONTD: promethazine (PHENERGAN) tablet 12.5 mg   ondansetron (ZOFRAN-ODT) disintegrating tablet 4 mg   metoCLOPramide (REGLAN) 10 MG tablet    Sig: Take 1 tablet (10 mg total) by mouth 3 (three) times daily with meals as needed for nausea.    Dispense:  60 tablet    Refill:  2    Order Specific Question:   Supervising Provider    Answer:   Jaynie Collins A [[id number]]   promethazine (PHENERGAN) 12.5 MG tablet    Sig: Take 1 tablet (12.5 mg total) by mouth at bedtime as needed for nausea or vomiting.    Dispense:  30 tablet    Refill:  0    Order Specific Question:   Supervising Provider    Answer:   Jaynie Collins A [[id number]]   famotidine (PEPCID) tablet 20 mg   famotidine (PEPCID) 20 MG tablet    Sig: Take 1 tablet (20 mg total) by mouth 2 (two) times daily as needed for heartburn or indigestion.    Dispense:  60 tablet    Refill:  2    Order Specific Question:   Supervising Provider    Answer:   Jaynie Collins A [[id number]]    Pt VS, including orthstatic VS  normal.  Hgb normal.  Nausea improved with Zofran 4 mg ODT x 1 in MAU.  Discussed Zofran in first trimester with pt and Rx for Reglan and Phenergan to start, return if vomiting not managed with those medications.  EKG is NSR, electrolytes wnl.  Dizziness likely mild dehydration or reaction to feeling nauseous or vomiting.  UA wnl so no measurable dehydration.  Pt reports bad breath/bad taste in her mouth and nausea when brushing teeth. Rx for Pepcid and discussed switching to kids fluoride toothpaste in fruity flavors instead of mint. Pt desires to see Dentist so dental letter provided. D/C home with antiemetics, pt to return if symptoms worsening.    ASSESSMENT 1. Nausea and vomiting during pregnancy prior to [redacted] weeks gestation   2. Dizziness   3. [redacted] weeks gestation of pregnancy   4. Gastroesophageal reflux disease, unspecified whether esophagitis present     PLAN Discharge home Allergies as of 08/23/2021   No Known Allergies      Medication List     STOP taking these medications    promethazine 25 MG suppository Commonly known as: PHENERGAN Replaced by: promethazine 12.5 MG tablet       TAKE these medications    famotidine 20 MG tablet Commonly known as: Pepcid Take 1 tablet (20 mg total) by mouth 2 (two) times daily as needed for heartburn or indigestion.   metoCLOPramide 10 MG tablet Commonly known as: Reglan Take 1 tablet (10 mg total) by mouth 3 (three) times daily with meals as needed for nausea.   promethazine 12.5 MG tablet Commonly known as: PHENERGAN Take 1 tablet (12.5 mg total) by mouth at bedtime as needed for nausea or vomiting. Replaces: promethazine 25 MG suppository   Vitafol Gummies 3.33-0.333-34.8 MG Chew Chew 3 tablets by mouth daily.  Sharen Counter Certified Nurse-Midwife 08/23/2021  11:25 PM

## 2021-08-23 NOTE — Discharge Instructions (Signed)
[facility] Providers   [facility]             [address], [place], [place] [address] [phone]  [facility]                                                             [address], [address], [place], [place], [address] [phone]  [facility]                                    [address], [address], [place], [place], [address] [phone]  [facility] [place] [address], [address], [place], [place], [address] [phone]  [facility]                                 [address], [place], [place], [address] [phone]  [facility]                                    [address], [place], [place], [address] [phone]  [facility] [address], [address], [place], , [address]                              [facility] [address], [place], , [address] [phone]  [facility]         Phone: [phone]  [facility]      Phone: [phone]   [facility]      Phone: [phone]  [facility]         Phone: [phone]   [facility]    Phone: [phone]  [facility]      Phone: [phone]  [facility]      Phone: [phone]  [facility]        Phone: [phone]  [facility]      Phone: [phone]  

## 2021-08-30 ENCOUNTER — Inpatient Hospital Stay (HOSPITAL_COMMUNITY)
Admission: AD | Admit: 2021-08-30 | Discharge: 2021-08-30 | Discharge status: Against Medical Advice | Payer: Medicaid Other | Attending: Obstetrics & Gynecology | Admitting: Obstetrics & Gynecology

## 2021-08-30 ENCOUNTER — Other Ambulatory Visit: Payer: Self-pay

## 2021-08-30 DIAGNOSIS — Z3A08 8 weeks gestation of pregnancy: Secondary | ICD-10-CM | POA: Diagnosis not present

## 2021-08-30 DIAGNOSIS — Z5321 Procedure and treatment not carried out due to patient leaving prior to being seen by health care provider: Secondary | ICD-10-CM | POA: Diagnosis not present

## 2021-08-30 DIAGNOSIS — O26891 Other specified pregnancy related conditions, first trimester: Secondary | ICD-10-CM | POA: Diagnosis present

## 2021-08-30 LAB — URINALYSIS, ROUTINE W REFLEX MICROSCOPIC
Bilirubin Urine: NEGATIVE
Glucose, UA: NEGATIVE mg/dL
Hgb urine dipstick: NEGATIVE
Ketones, ur: NEGATIVE mg/dL
Nitrite: NEGATIVE
Protein, ur: NEGATIVE mg/dL
Specific Gravity, Urine: 1.012 (ref 1.005–1.030)
pH: 6 (ref 5.0–8.0)

## 2021-08-30 NOTE — MAU Note (Signed)
.  Mandy Eaton is a 26 y.o. at [redacted]w[redacted]d here in MAU reporting: lower abdominal pain that began this morning that @ 0800.  Reports pain is sharp & constant..  Denies VB. LMP: Unsure Onset of complaint: today Pain score: 8/10 Vitals:   08/30/21 1720  BP: 98/62  Pulse: 64  Resp: 18  Temp: 99.2 F (37.3 C)  SpO2: 100%     FHT:N/A Lab orders placed from triage:   UA

## 2021-08-30 NOTE — MAU Note (Signed)
[place] Tech reported to [employer] that pt left AMA. Pt told Patient Access that she needed to leave and left AMA. Charge RN discharged patient.

## 2021-09-13 ENCOUNTER — Ambulatory Visit (INDEPENDENT_AMBULATORY_CARE_PROVIDER_SITE_OTHER): Payer: Medicaid Other

## 2021-09-13 ENCOUNTER — Other Ambulatory Visit: Payer: Self-pay

## 2021-09-13 DIAGNOSIS — Z31430 Encounter of female for testing for genetic disease carrier status for procreative management: Secondary | ICD-10-CM

## 2021-09-13 DIAGNOSIS — Z3481 Encounter for supervision of other normal pregnancy, first trimester: Secondary | ICD-10-CM

## 2021-09-13 DIAGNOSIS — Z3490 Encounter for supervision of normal pregnancy, unspecified, unspecified trimester: Secondary | ICD-10-CM

## 2021-09-13 MED ORDER — GOJJI WEIGHT SCALE MISC: 1.0000 | 0 refills | Status: AC | PRN

## 2021-09-13 MED ORDER — BLOOD PRESSURE KIT DEVI: 1.0000 | 0 refills | Status: AC | PRN
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
## 2021-09-14 LAB — CBC/D/PLT+RPR+RH+ABO+RUBIGG...
Antibody Screen: NEGATIVE
Basophils Absolute: 0 10*3/uL (ref 0.0–0.2)
Basos: 0 %
EOS (ABSOLUTE): 0.1 10*3/uL (ref 0.0–0.4)
Eos: 1 %
HCV Ab: NONREACTIVE
HIV Screen 4th Generation wRfx: NONREACTIVE
Hematocrit: 35.3 % (ref 34.0–46.6)
Hemoglobin: 11.8 g/dL (ref 11.1–15.9)
Hepatitis B Surface Ag: NEGATIVE
Immature Grans (Abs): 0 10*3/uL (ref 0.0–0.1)
Immature Granulocytes: 0 %
Lymphocytes Absolute: 1.5 10*3/uL (ref 0.7–3.1)
Lymphs: 18 %
MCH: 26.8 pg (ref 26.6–33.0)
MCHC: 33.4 g/dL (ref 31.5–35.7)
MCV: 80 fL (ref 79–97)
Monocytes Absolute: 0.5 10*3/uL (ref 0.1–0.9)
Monocytes: 6 %
Neutrophils Absolute: 6.2 10*3/uL (ref 1.4–7.0)
Neutrophils: 75 %
Platelets: 274 10*3/uL (ref 150–450)
RBC: 4.41 x10E6/uL (ref 3.77–5.28)
RDW: 16.4 % — ABNORMAL HIGH (ref 11.7–15.4)
RPR Ser Ql: NONREACTIVE
Rh Factor: POSITIVE
Rubella Antibodies, IGG: 5.88 index (ref >0.99)
WBC: 8.2 10*3/uL (ref 3.4–10.8)

## 2021-09-14 LAB — HCV INTERPRETATION

## 2021-09-14 LAB — HEMOGLOBIN A1C
Est. average glucose Bld gHb Est-mCnc: 100 mg/dL
Hgb A1c MFr Bld: 5.1 % (ref 4.8–5.6)

## 2021-09-19 ENCOUNTER — Other Ambulatory Visit: Payer: Medicaid Other

## 2021-09-19 ENCOUNTER — Ambulatory Visit: Payer: Medicaid Other

## 2021-09-19 ENCOUNTER — Telehealth: Payer: Self-pay

## 2021-09-19 NOTE — Telephone Encounter (Signed)
Called pt and left VM stating appt for [place] this AM is not needed, please call back to discuss. MyChart message sent.

## 2021-09-21 ENCOUNTER — Inpatient Hospital Stay (HOSPITAL_COMMUNITY)
Admission: AD | Admit: 2021-09-21 | Discharge: 2021-09-21 | Discharge status: Against Medical Advice | Payer: Medicaid Other | Attending: Obstetrics and Gynecology | Admitting: Obstetrics and Gynecology

## 2021-09-21 DIAGNOSIS — N898 Other specified noninflammatory disorders of vagina: Secondary | ICD-10-CM | POA: Diagnosis not present

## 2021-09-21 DIAGNOSIS — Z3A00 Weeks of gestation of pregnancy not specified: Secondary | ICD-10-CM | POA: Insufficient documentation

## 2021-09-21 DIAGNOSIS — O219 Vomiting of pregnancy, unspecified: Secondary | ICD-10-CM | POA: Insufficient documentation

## 2021-09-21 DIAGNOSIS — O26899 Other specified pregnancy related conditions, unspecified trimester: Secondary | ICD-10-CM | POA: Insufficient documentation

## 2021-09-21 LAB — URINALYSIS, ROUTINE W REFLEX MICROSCOPIC
Bilirubin Urine: NEGATIVE
Glucose, UA: NEGATIVE mg/dL
Hgb urine dipstick: NEGATIVE
Ketones, ur: NEGATIVE mg/dL
Nitrite: NEGATIVE
Protein, ur: NEGATIVE mg/dL
Specific Gravity, Urine: 1.02 (ref 1.005–1.030)
pH: 6 (ref 5.0–8.0)

## 2021-09-21 LAB — WET PREP, GENITAL
Clue Cells Wet Prep HPF POC: NONE SEEN
Sperm: NONE SEEN
Trich, Wet Prep: NONE SEEN
WBC, Wet Prep HPF POC: >=10 — AB (ref <10)
Yeast Wet Prep HPF POC: NONE SEEN

## 2021-09-21 NOTE — MAU Note (Signed)
Pt says she has watery white vag D/C -started Monday  Last sex- June Still Vomiting - Phenergan- last taken was Monday- hurts stomach  Took Diclegis - 2 weeks ago

## 2021-09-22 LAB — GC/CHLAMYDIA PROBE AMP (~~LOC~~) NOT AT ARMC
Chlamydia: NEGATIVE
Comment: NEGATIVE
Comment: NORMAL
Neisseria Gonorrhea: NEGATIVE

## 2021-09-29 ENCOUNTER — Inpatient Hospital Stay (HOSPITAL_COMMUNITY)
Admission: AD | Admit: 2021-09-29 | Discharge: 2021-09-29 | Disposition: A | Discharge status: Home / Self Care | Payer: Medicaid Other | Attending: Obstetrics & Gynecology | Admitting: Obstetrics & Gynecology

## 2021-09-29 ENCOUNTER — Encounter (HOSPITAL_COMMUNITY): Payer: Self-pay | Admitting: Obstetrics & Gynecology

## 2021-09-29 DIAGNOSIS — Z3A12 12 weeks gestation of pregnancy: Secondary | ICD-10-CM | POA: Diagnosis not present

## 2021-09-29 DIAGNOSIS — R12 Heartburn: Secondary | ICD-10-CM | POA: Diagnosis not present

## 2021-09-29 DIAGNOSIS — R109 Unspecified abdominal pain: Secondary | ICD-10-CM | POA: Diagnosis not present

## 2021-09-29 DIAGNOSIS — O26899 Other specified pregnancy related conditions, unspecified trimester: Secondary | ICD-10-CM

## 2021-09-29 DIAGNOSIS — R112 Nausea with vomiting, unspecified: Secondary | ICD-10-CM | POA: Insufficient documentation

## 2021-09-29 DIAGNOSIS — O99611 Diseases of the digestive system complicating pregnancy, first trimester: Secondary | ICD-10-CM | POA: Insufficient documentation

## 2021-09-29 DIAGNOSIS — Z3490 Encounter for supervision of normal pregnancy, unspecified, unspecified trimester: Secondary | ICD-10-CM

## 2021-09-29 DIAGNOSIS — O219 Vomiting of pregnancy, unspecified: Secondary | ICD-10-CM

## 2021-09-29 LAB — URINALYSIS, ROUTINE W REFLEX MICROSCOPIC
Bacteria, UA: NONE SEEN
Bilirubin Urine: NEGATIVE
Glucose, UA: NEGATIVE mg/dL
Hgb urine dipstick: NEGATIVE
Ketones, ur: NEGATIVE mg/dL
Nitrite: NEGATIVE
Protein, ur: 30 mg/dL — AB
Specific Gravity, Urine: 1.027 (ref 1.005–1.030)
pH: 5 (ref 5.0–8.0)

## 2021-09-29 MED ORDER — ONDANSETRON 4 MG PO TBDP
8.0000 mg | ORAL_TABLET | Freq: Once | ORAL | Status: AC
  Administered 2021-09-29: 8 mg via ORAL
  Filled 2021-09-29: qty 2

## 2021-09-29 MED ORDER — ONDANSETRON 4 MG PO TBDP: 4.0000 mg | ORAL_TABLET | Freq: Four times a day (QID) | ORAL | 0 refills | Status: DC | PRN

## 2021-09-29 MED ORDER — ACETAMINOPHEN 160 MG/5ML PO SOLN
650.0000 mg | Freq: Once | ORAL | Status: AC
  Administered 2021-09-29: 650 mg via ORAL
  Filled 2021-09-29: qty 20.3

## 2021-09-29 MED ORDER — PROMETHAZINE HCL 12.5 MG PO TABS: 12.5000 mg | ORAL_TABLET | Freq: Four times a day (QID) | ORAL | 0 refills | Status: AC | PRN

## 2021-09-29 MED ORDER — CALCIUM CARBONATE ANTACID 500 MG PO CHEW
2.0000 | CHEWABLE_TABLET | Freq: Once | ORAL | Status: AC
  Administered 2021-09-29 (×2): 200 mg via ORAL
  Filled 2021-09-29: qty 2

## 2021-09-29 NOTE — MAU Provider Note (Signed)
History     CSN: [id number]  Arrival date and time: 09/29/21 2054   Event Date/Time   First Provider Initiated Contact with Patient 09/29/21 2140      No chief complaint on file.  Mandy Eaton is a 26 y.o. [id number] at [redacted]w[redacted]d who receives care at [facility].  She reports her appt is scheduled for August 8th. She presents today for abdominal pain.  She states she has been experiencing lower right side abdominal pain for the past 2 weeks. She states the pain is improved with sitting and worsened with walking.  She states the pain makes her who abdomen tight. She rates the pain a 9/10 and reports taking tylenol, but threw it up.  She reports that she had an [place] in [place] and was told she has twin gestation and a cyst.  She also expresses concern for nausea and has thrown up 3x today.  She reports she was taking phenergan, but ran out 2 weeks ago. She further states that it was helpful when she was taking it.  She also reports some heartburn.  She reports eating "chinese food," but threw up about 5 minutes later.     OB History     Gravida  4   Para  1   Term  1   Preterm      AB  2   Living  1      SAB  1   IAB  1   Ectopic      Multiple      Live Births  1           Past Medical History:  Diagnosis Date   Anemia    patient was unaware.    Past Surgical History:  Procedure Laterality Date   DILATION AND CURETTAGE OF UTERUS  2017   12wk elective abortion    Family History  Problem Relation Age of Onset   Arthritis Mother    Hypertension Mother    Hypertension Father     Social History   Tobacco Use   Smoking status: Never   Smokeless tobacco: Never  Vaping Use   Vaping Use: Never used  Substance Use Topics   Alcohol use: Not Currently   Drug use: Never    Allergies: No Known Allergies  Medications Prior to Admission  Medication Sig Dispense Refill Last Dose   Blood Pressure Monitoring (BLOOD PRESSURE KIT) DEVI 1 Device by Does not apply route as  needed. 1 each 0    famotidine (PEPCID) 20 MG tablet Take 1 tablet (20 mg total) by mouth 2 (two) times daily as needed for heartburn or indigestion. 60 tablet 2    metoCLOPramide (REGLAN) 10 MG tablet Take 1 tablet (10 mg total) by mouth 3 (three) times daily with meals as needed for nausea. (Patient not taking: Reported on 09/13/2021) 60 tablet 2    Misc. Devices (GOJJI WEIGHT SCALE) MISC 1 Device by Does not apply route as needed. 1 each 0    Prenatal Vit-Fe Phos-FA-Omega (VITAFOL GUMMIES) 3.33-0.333-34.8 MG CHEW Chew 3 tablets by mouth daily. (Patient not taking: Reported on 06/01/2021) 90 tablet 10    promethazine (PHENERGAN) 12.5 MG tablet Take 1 tablet (12.5 mg total) by mouth at bedtime as needed for nausea or vomiting. (Patient not taking: Reported on 09/13/2021) 30 tablet 0     Review of Systems  Constitutional:  Negative for chills and fever.  Gastrointestinal:  Positive for abdominal pain, nausea and vomiting. Negative for  constipation and diarrhea.  Genitourinary:  Positive for dysuria. Negative for difficulty urinating, vaginal bleeding and vaginal discharge.  Neurological:  Positive for dizziness and headaches (Frontal "really want to go to sleep, but I cant b/c it hurts so bad."). Negative for light-headedness.   Physical Exam   Blood pressure 97/62, pulse 79, temperature 98.8 F (37.1 C), temperature source Oral, resp. rate 20, height 5\' 3"  (1.6 m), weight 50.9 kg, last menstrual period 03/19/2021, unknown if currently breastfeeding.  Physical Exam Vitals reviewed.  Constitutional:      General: She is not in acute distress.    Appearance: Normal appearance. She is well-developed.  HENT:     Head: Normocephalic and atraumatic.  Eyes:     Conjunctiva/sclera: Conjunctivae normal.  Cardiovascular:     Rate and Rhythm: Normal rate.  Pulmonary:     Effort: Pulmonary effort is normal. No respiratory distress.  Abdominal:     Palpations: Abdomen is soft.     Tenderness: There  is generalized abdominal tenderness.  Musculoskeletal:     Cervical back: Normal range of motion.  Skin:    General: Skin is warm and dry.  Neurological:     Mental Status: She is alert and oriented to person, place, and time.  Psychiatric:        Mood and Affect: Mood normal.        Behavior: Behavior normal.     MAU Course  Procedures Results for orders placed or performed during the hospital encounter of 09/29/21 (from the past 24 hour(s))  Urinalysis, Routine w reflex microscopic Urine, Clean Catch     Status: Abnormal   Collection Time: 09/29/21  9:18 PM  Result Value Ref Range   Color, Urine YELLOW YELLOW   APPearance HAZY (A) CLEAR   Specific Gravity, Urine 1.027 1.005 - 1.030   pH 5.0 5.0 - 8.0   Glucose, UA NEGATIVE NEGATIVE mg/dL   Hgb urine dipstick NEGATIVE NEGATIVE   Bilirubin Urine NEGATIVE NEGATIVE   Ketones, ur NEGATIVE NEGATIVE mg/dL   Protein, ur 30 (A) NEGATIVE mg/dL   Nitrite NEGATIVE NEGATIVE   Leukocytes,Ua TRACE (A) NEGATIVE   RBC / HPF 0-5 0 - 5 RBC/hpf   WBC, UA 6-10 0 - 5 WBC/hpf   Bacteria, UA NONE SEEN NONE SEEN   Squamous Epithelial / LPF 0-5 0 - 5   Mucus PRESENT        MDM Labs: UA Antiemetic BSUS Prescription Assessment and Plan  26 year old G4P1021 at 12.1 weeks Abdominal Pain Nausea/Vomiting  -POC reviewed. -Discussed usage of antiemetic for nausea. -Once nausea resolved will proceed with pain medication for abdomen. -Patient informed that provider willing to perform BSUS to r/o twin gestation. Patient agreeable. -Zofran 8mg  ODT ordered. -Patient requests and given ginger ale.   Maryann Conners 09/29/2021, 9:42 PM   Reassessment (10:50 PM) -Patient reports resolution of nausea. -Requests pain medication be in liquid form. -Also reports heartburn.  Discussed usage of tums at home. Will also give dose here. -Tylenol solution and tums ordered. -BSUS completed showing SIUP c/w dates and FHR of 152. -Reassured that no twin  gestation noted. -Plan to discharge once medications given.  Reassessment (11:28 PM) -Nurse reports patient without pain. -Plan to send limited script for Zofran as well as refill for phenergan. -Patient encouraged to take tums at home for heartburn. -Keep appt as scheduled. -Encouraged to call primary office or return to MAU if symptoms worsen or with the onset of new symptoms. -  Discharged to home in stable condition.  Maryann Conners MSN, CNM Advanced Practice Provider, [facility] [employer] [facility]

## 2021-09-29 NOTE — MAU Note (Addendum)
Pt says she was here last week for vomiting - - left before evaluated No meds  Has vomited x3 today  Went to [place] last week - had U/S - told she had a cyst

## 2021-10-03 ENCOUNTER — Other Ambulatory Visit: Payer: Medicaid Other

## 2021-10-05 ENCOUNTER — Inpatient Hospital Stay (HOSPITAL_COMMUNITY)
Admission: AD | Admit: 2021-10-05 | Discharge: 2021-10-05 | Discharge status: Against Medical Advice | Payer: Medicaid Other | Attending: Obstetrics and Gynecology | Admitting: Obstetrics and Gynecology

## 2021-10-05 DIAGNOSIS — Z608 Other problems related to social environment: Secondary | ICD-10-CM

## 2021-10-05 DIAGNOSIS — Z5321 Procedure and treatment not carried out due to patient leaving prior to being seen by health care provider: Secondary | ICD-10-CM | POA: Diagnosis not present

## 2021-10-05 DIAGNOSIS — O219 Vomiting of pregnancy, unspecified: Secondary | ICD-10-CM

## 2021-10-05 LAB — URINALYSIS, ROUTINE W REFLEX MICROSCOPIC
Bilirubin Urine: NEGATIVE
Glucose, UA: NEGATIVE mg/dL
Hgb urine dipstick: NEGATIVE
Ketones, ur: NEGATIVE mg/dL
Nitrite: NEGATIVE
Protein, ur: NEGATIVE mg/dL
Specific Gravity, Urine: 1.025 (ref 1.005–1.030)
pH: 5 (ref 5.0–8.0)

## 2021-10-05 MED ORDER — ONDANSETRON 4 MG PO TBDP: 4.0000 mg | ORAL_TABLET | Freq: Four times a day (QID) | ORAL | 0 refills | Status: DC | PRN

## 2021-10-05 MED ORDER — SCOPOLAMINE 1 MG/3DAYS TD PT72
1.0000 | MEDICATED_PATCH | TRANSDERMAL | 12 refills | Status: AC
Start: 2021-10-05 — End: ?

## 2021-10-05 NOTE — MAU Note (Signed)
AMA form signed and brought to nurses station from registration. Provider notified.
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
## 2021-10-13 ENCOUNTER — Encounter: Payer: Medicaid Other | Admitting: Obstetrics and Gynecology

## 2021-10-16 ENCOUNTER — Encounter: Payer: Self-pay | Admitting: Family Medicine

## 2021-10-16 ENCOUNTER — Other Ambulatory Visit: Payer: Self-pay

## 2021-10-16 MED ORDER — ONDANSETRON 4 MG PO TBDP
4.0000 mg | ORAL_TABLET | Freq: Four times a day (QID) | ORAL | 2 refills | Status: AC | PRN
Start: 2021-10-16 — End: ?

## 2021-10-16 NOTE — Progress Notes (Signed)
Pt called about needing more Rx Zofran due to nausea and vomiting. Rx sent to pharmacy.  Ovadia Lopp,RNC

## 2021-10-20 ENCOUNTER — Encounter (HOSPITAL_COMMUNITY): Payer: Self-pay | Admitting: Obstetrics and Gynecology

## 2021-10-20 ENCOUNTER — Inpatient Hospital Stay (HOSPITAL_COMMUNITY)
Admission: AD | Admit: 2021-10-20 | Discharge: 2021-10-20 | Disposition: A | Discharge status: Home / Self Care | Payer: Medicaid Other | Attending: Obstetrics and Gynecology | Admitting: Obstetrics and Gynecology

## 2021-10-20 DIAGNOSIS — R112 Nausea with vomiting, unspecified: Secondary | ICD-10-CM | POA: Diagnosis present

## 2021-10-20 DIAGNOSIS — O219 Vomiting of pregnancy, unspecified: Secondary | ICD-10-CM | POA: Diagnosis not present

## 2021-10-20 DIAGNOSIS — Z3A15 15 weeks gestation of pregnancy: Secondary | ICD-10-CM | POA: Diagnosis not present

## 2021-10-20 LAB — URINALYSIS, ROUTINE W REFLEX MICROSCOPIC
Bilirubin Urine: NEGATIVE
Glucose, UA: NEGATIVE mg/dL
Hgb urine dipstick: NEGATIVE
Ketones, ur: NEGATIVE mg/dL
Nitrite: NEGATIVE
Protein, ur: NEGATIVE mg/dL
Specific Gravity, Urine: 1.026 (ref 1.005–1.030)
pH: 5 (ref 5.0–8.0)

## 2021-10-20 MED ORDER — ONDANSETRON 4 MG PO TBDP
8.0000 mg | ORAL_TABLET | Freq: Once | ORAL | Status: AC
  Administered 2021-10-20: 8 mg via ORAL
  Filled 2021-10-20: qty 2

## 2021-10-20 NOTE — MAU Provider Note (Signed)
History     CSN: [id number]  Arrival date and time: 10/20/21 1113   Event Date/Time   First Provider Initiated Contact with Patient 10/20/21 1148      Chief Complaint  Patient presents with   Abdominal Pain   Emesis   HPI  Mandy Eaton is a 26 y.o. [id number] at [redacted]w[redacted]d who presents for evaluation of nausea. Patient reports she ran out of her zofran and is having nausea and vomiting. She did not know that they had sent a new prescription for zofran to her pharmacy when she called on 8/21. She has not picked up her scopolamine patches. She reports the zofran works well when she takes it. She denies any pain.  She denies any vaginal bleeding, discharge, and leaking of fluid. Denies any constipation, diarrhea or any urinary complaints.   OB History     Gravida  4   Para  1   Term  1   Preterm      AB  2   Living  1      SAB  1   IAB  1   Ectopic      Multiple      Live Births  1           Past Medical History:  Diagnosis Date   Anemia    patient was unaware.    Past Surgical History:  Procedure Laterality Date   DILATION AND CURETTAGE OF UTERUS  2017   12wk elective abortion    Family History  Problem Relation Age of Onset   Arthritis Mother    Hypertension Mother    Hypertension Father     Social History   Tobacco Use   Smoking status: Never   Smokeless tobacco: Never  Vaping Use   Vaping Use: Never used  Substance Use Topics   Alcohol use: Not Currently   Drug use: Never    Allergies: No Known Allergies  No medications prior to admission.    Review of Systems  Constitutional: Negative.  Negative for fatigue and fever.  HENT: Negative.    Respiratory: Negative.  Negative for shortness of breath.   Cardiovascular: Negative.  Negative for chest pain.  Gastrointestinal:  Positive for nausea and vomiting. Negative for abdominal pain, constipation and diarrhea.  Genitourinary: Negative.  Negative for dysuria, vaginal bleeding and vaginal  discharge.  Neurological: Negative.  Negative for dizziness and headaches.   Physical Exam   Blood pressure (!) 95/49, pulse 73, temperature 98.4 F (36.9 C), temperature source Oral, resp. rate 15, height 5\' 3"  (1.6 m), weight 51.4 kg, last menstrual period 03/19/2021, SpO2 100 %, unknown if currently breastfeeding.  Patient Vitals for the past 24 hrs:  BP Temp Temp src Pulse Resp SpO2 Height Weight  10/20/21 1243 (!) 95/49 -- -- 73 15 100 % -- --  10/20/21 1128 97/60 98.4 F (36.9 C) Oral 96 16 98 % 5\' 3"  (1.6 m) 51.4 kg    Physical Exam Vitals and nursing note reviewed.  Constitutional:      General: She is not in acute distress.    Appearance: She is well-developed.  HENT:     Head: Normocephalic.  Eyes:     Pupils: Pupils are equal, round, and reactive to light.  Cardiovascular:     Rate and Rhythm: Normal rate and regular rhythm.     Heart sounds: Normal heart sounds.  Pulmonary:     Effort: Pulmonary effort is normal. No respiratory distress.  Breath sounds: Normal breath sounds.  Abdominal:     General: Bowel sounds are normal. There is no distension.     Palpations: Abdomen is soft.     Tenderness: There is no abdominal tenderness.  Skin:    General: Skin is warm and dry.  Neurological:     Mental Status: She is alert and oriented to person, place, and time.  Psychiatric:        Mood and Affect: Mood normal.        Behavior: Behavior normal.        Thought Content: Thought content normal.        Judgment: Judgment normal.     FHT: 160 bpm   MAU Course  Procedures  Results for orders placed or performed during the hospital encounter of 10/20/21 (from the past 24 hour(s))  Urinalysis, Routine w reflex microscopic Urine, Clean Catch     Status: Abnormal   Collection Time: 10/20/21 11:53 AM  Result Value Ref Range   Color, Urine YELLOW YELLOW   APPearance HAZY (A) CLEAR   Specific Gravity, Urine 1.026 1.005 - 1.030   pH 5.0 5.0 - 8.0   Glucose, UA  NEGATIVE NEGATIVE mg/dL   Hgb urine dipstick NEGATIVE NEGATIVE   Bilirubin Urine NEGATIVE NEGATIVE   Ketones, ur NEGATIVE NEGATIVE mg/dL   Protein, ur NEGATIVE NEGATIVE mg/dL   Nitrite NEGATIVE NEGATIVE   Leukocytes,Ua SMALL (A) NEGATIVE   RBC / HPF 0-5 0 - 5 RBC/hpf   WBC, UA 6-10 0 - 5 WBC/hpf   Bacteria, UA FEW (A) NONE SEEN   Squamous Epithelial / LPF 0-5 0 - 5   Mucus PRESENT      MDM Labs ordered and reviewed.   UA Zofran ODT No episodes of vomiting in MAU  CNM encouraged patient to pick up previously prescribed medication  Assessment and Plan   1. Nausea and vomiting during pregnancy   2. [redacted] weeks gestation of pregnancy     -Discharge home in stable condition -Second trimester precautions discussed -Patient advised to follow-up with OB as scheduled for prenatal care -Patient may return to MAU as needed or if her condition were to change or worsen  Rolm Bookbinder, CNM 10/20/2021, 11:48 AM

## 2021-10-20 NOTE — MAU Note (Signed)
Mandy Eaton is a 26 y.o. at [redacted]w[redacted]d here in MAU reporting: vomiting too much and having stomach pain in lower abd.  Ran out of Zofan yesterday, nausea is worse.   Onset of complaint: ongoing. Pain score: 7 Vitals:   10/20/21 1128  BP: 97/60  Pulse: 96  Resp: 16  Temp: 98.4 F (36.9 C)  SpO2: 98%     FHT:160 Lab orders placed from triage:  urine

## 2021-10-20 NOTE — Discharge Instructions (Signed)

## 2021-11-06 ENCOUNTER — Inpatient Hospital Stay (HOSPITAL_COMMUNITY)
Admission: AD | Admit: 2021-11-06 | Discharge: 2021-11-06 | Disposition: A | Discharge status: Home / Self Care | Payer: Medicaid Other | Attending: Obstetrics & Gynecology | Admitting: Obstetrics & Gynecology

## 2021-11-06 ENCOUNTER — Encounter (HOSPITAL_COMMUNITY): Payer: Self-pay | Admitting: Obstetrics & Gynecology

## 2021-11-06 DIAGNOSIS — O99612 Diseases of the digestive system complicating pregnancy, second trimester: Secondary | ICD-10-CM | POA: Insufficient documentation

## 2021-11-06 DIAGNOSIS — R109 Unspecified abdominal pain: Secondary | ICD-10-CM | POA: Diagnosis present

## 2021-11-06 DIAGNOSIS — O219 Vomiting of pregnancy, unspecified: Secondary | ICD-10-CM | POA: Diagnosis not present

## 2021-11-06 DIAGNOSIS — O26892 Other specified pregnancy related conditions, second trimester: Secondary | ICD-10-CM | POA: Insufficient documentation

## 2021-11-06 DIAGNOSIS — Z3492 Encounter for supervision of normal pregnancy, unspecified, second trimester: Secondary | ICD-10-CM

## 2021-11-06 DIAGNOSIS — Z3A17 17 weeks gestation of pregnancy: Secondary | ICD-10-CM | POA: Diagnosis not present

## 2021-11-06 DIAGNOSIS — K219 Gastro-esophageal reflux disease without esophagitis: Secondary | ICD-10-CM | POA: Diagnosis not present

## 2021-11-06 LAB — URINALYSIS, ROUTINE W REFLEX MICROSCOPIC
Bilirubin Urine: NEGATIVE
Glucose, UA: NEGATIVE mg/dL
Hgb urine dipstick: NEGATIVE
Ketones, ur: NEGATIVE mg/dL
Leukocytes,Ua: NEGATIVE
Nitrite: NEGATIVE
Protein, ur: NEGATIVE mg/dL
Specific Gravity, Urine: 1.026 (ref 1.005–1.030)
pH: 5 (ref 5.0–8.0)

## 2021-11-06 LAB — WET PREP, GENITAL
Clue Cells Wet Prep HPF POC: NONE SEEN
Sperm: NONE SEEN
Trich, Wet Prep: NONE SEEN
WBC, Wet Prep HPF POC: <10 (ref <10)
Yeast Wet Prep HPF POC: NONE SEEN

## 2021-11-06 LAB — RAPID URINE DRUG SCREEN, HOSP PERFORMED
Amphetamines: NOT DETECTED
Barbiturates: NOT DETECTED
Benzodiazepines: NOT DETECTED
Cocaine: NOT DETECTED
Opiates: NOT DETECTED
Tetrahydrocannabinol: NOT DETECTED

## 2021-11-06 MED ORDER — KETOROLAC TROMETHAMINE 60 MG/2ML IM SOLN
60.0000 mg | Freq: Once | INTRAMUSCULAR | Status: AC
  Administered 2021-11-06: 60 mg via INTRAMUSCULAR
  Filled 2021-11-06: qty 2

## 2021-11-06 MED ORDER — CAPSAICIN 0.025 % EX CREA
TOPICAL_CREAM | Freq: Two times a day (BID) | CUTANEOUS | Status: DC
  Filled 2021-11-06: qty 60

## 2021-11-06 NOTE — MAU Note (Addendum)
Mandy Eaton is a 26 y.o. at [redacted]w[redacted]d here in MAU reporting: having severe pain all over her stomach, gets tight, really hard.  Started this morning around 0900. States hasn't been able to sleep.  Continues to throw up, has been yellow fluid that burns her mouth. Denies vag bleeding.   Onset of complaint: 0900 Pain score: 9 Vitals:   11/06/21 1648  BP: 96/62  Pulse: 98  Resp: 17  Temp: 98.3 F (36.8 C)  SpO2: 99%     FHT:156 Lab orders placed from triage:  urine  Abd soft on palpation.

## 2021-11-06 NOTE — MAU Provider Note (Signed)
History     CSN: [id number]  Arrival date and time: 11/06/21 1625   Event Date/Time   First Provider Initiated Contact with Patient 11/06/21 1726      Chief Complaint  Patient presents with   Abdominal Pain   Emesis   Mandy Eaton, a  26 y.o. [id number] at [redacted]w[redacted]d presents to MAU with complaints of "stomach tightening" and "throwing up yellow stuff that burns." Patient states the tightening has been on-going. During sleep, laying down and sitting down too long is when its is the worse per patient.  Denies attempting to relieve pain. Rating pain a 9/10. Denies vaginal and urinary symptoms. Endorses fetal movement.  Diet Recall:  No water intake today, diet recall- Cereal granola and taco bell. Apple juice, milk, and root beer. Complaints of acid reflux after eating [employer]. States she can not swallow pills.           OB History     Gravida  4   Para  1   Term  1   Preterm      AB  2   Living  1      SAB  1   IAB  1   Ectopic      Multiple      Live Births  1           Past Medical History:  Diagnosis Date   Anemia    patient was unaware.    Past Surgical History:  Procedure Laterality Date   DILATION AND CURETTAGE OF UTERUS  2017   12wk elective abortion    Family History  Problem Relation Age of Onset   Arthritis Mother    Hypertension Mother    Hypertension Father     Social History   Tobacco Use   Smoking status: Never   Smokeless tobacco: Never  Vaping Use   Vaping Use: Never used  Substance Use Topics   Alcohol use: Not Currently   Drug use: Never    Allergies: No Known Allergies  Medications Prior to Admission  Medication Sig Dispense Refill Last Dose   ondansetron (ZOFRAN-ODT) 4 MG disintegrating tablet Take 1 tablet (4 mg total) by mouth every 6 (six) hours as needed for nausea. 30 tablet 2 11/06/2021   Blood Pressure Monitoring (BLOOD PRESSURE KIT) DEVI 1 Device by Does not apply route as needed. 1 each 0    famotidine  (PEPCID) 20 MG tablet Take 1 tablet (20 mg total) by mouth 2 (two) times daily as needed for heartburn or indigestion. 60 tablet 2 More than a month   Misc. Devices (GOJJI WEIGHT SCALE) MISC 1 Device by Does not apply route as needed. 1 each 0    Prenatal Vit-Fe Phos-FA-Omega (VITAFOL GUMMIES) 3.33-0.333-34.8 MG CHEW Chew 3 tablets by mouth daily. (Patient not taking: Reported on 06/01/2021) 90 tablet 10    promethazine (PHENERGAN) 12.5 MG tablet Take 1 tablet (12.5 mg total) by mouth every 6 (six) hours as needed for nausea or vomiting. 45 tablet 0 More than a month   scopolamine (TRANSDERM-SCOP) 1 MG/3DAYS Place 1 patch (1.5 mg total) onto the skin every 3 (three) days. 10 patch 12 More than a month    Review of Systems  Constitutional:  Negative for chills, fatigue and fever.  Eyes:  Negative for pain and visual disturbance.  Respiratory:  Negative for apnea, shortness of breath and wheezing.   Cardiovascular:  Negative for chest pain and palpitations.  Gastrointestinal:  Positive for  abdominal pain. Negative for constipation, diarrhea, nausea and vomiting.  Genitourinary:  Positive for pelvic pain. Negative for difficulty urinating, dysuria, vaginal bleeding, vaginal discharge and vaginal pain.  Musculoskeletal:  Negative for back pain.  Neurological:  Negative for seizures, weakness and headaches.  Psychiatric/Behavioral:  Negative for suicidal ideas.    Physical Exam   Blood pressure 96/62, pulse 98, temperature 98.3 F (36.8 C), temperature source Oral, resp. rate 17, height $RemoveBe'5\' 3"'FozrkLqXZ$  (1.6 m), weight 52.6 kg, last menstrual period 03/19/2021, SpO2 99 %, unknown if currently breastfeeding.  Physical Exam Vitals and nursing note reviewed.  Constitutional:      General: She is not in acute distress.    Appearance: Normal appearance.  HENT:     Head: Normocephalic.  Pulmonary:     Effort: Pulmonary effort is normal.  Abdominal:     Palpations: Abdomen is soft.     Tenderness: There is  no abdominal tenderness. There is no right CVA tenderness or left CVA tenderness.  Musculoskeletal:     Cervical back: Normal range of motion.  Skin:    General: Skin is warm and dry.  Neurological:     Mental Status: She is alert and oriented to person, place, and time.  Psychiatric:        Mood and Affect: Mood normal.     MAU Course  Procedures Orders Placed This Encounter  Procedures   Wet prep, genital   Urinalysis, Routine w reflex microscopic Urine, Clean Catch   Rapid urine drug screen (hospital performed)   Meds ordered this encounter  Medications   capsicum (ZOSTRIX) 0.075 % cream    MDM Lab results reviewed and interpreted by me.   - UA unremarkable. No signs of dehydration requiring fluids.  - Patient overall well appearing, with no episodes of vomiting at this time.  - Topical Capsicin cream ordered for pain. - Reflux resolved spontaneously  - Pain improved with capsaicin cream  - Upon reassessment pain unrelieved with Capsaicin cream. Continues to complain of intermittent "tightening"/  - Recommended that CNM check cervix to rule out Preterm Labor.  - Patient declines cervical exam. Toco applied to rule out PTL.   - Discussed that pain management options are limited due to patient not being able to take PO Meds.  - Offered IM Injection for pain, patient agreeable to plan of care.  - Pain relieved with IM Toradol.   - No contractions observed on Toco  - Plan for discharge  Assessment and Plan   1. Abdominal pain, unspecified abdominal location   2. Encounter for supervision of low-risk pregnancy in second trimester   3. Gastroesophageal reflux disease, unspecified whether esophagitis present   4. [redacted] weeks gestation of pregnancy   - Discussed that pain is likely related to fetal movement. Also discussed Round Ligament pain and comfort measures that may help at home.  - Also discussed comfort measures for acid reflux and GERD like symptoms.  - Worsening  symptoms and return precautions reviewed  - Preterm labor precautions reviewed  - Patient discharged home in stable condition and may return to MAU as needed .    Colie Josten Isaias Sakai) Rollene Rotunda, MSN, [place] [facility] [place]  11/06/21 9:15 PM

## 2021-11-07 ENCOUNTER — Telehealth: Payer: Self-pay | Admitting: Licensed Clinical Social Worker

## 2021-11-07 LAB — GC/CHLAMYDIA PROBE AMP (~~LOC~~) NOT AT ARMC
Chlamydia: NEGATIVE
Comment: NEGATIVE
Comment: NORMAL
Neisseria Gonorrhea: NEGATIVE

## 2021-11-07 NOTE — Telephone Encounter (Signed)
Called pt regarding [facility] referral left message requesting callback

## 2021-11-16 ENCOUNTER — Encounter: Payer: Self-pay | Admitting: Obstetrics and Gynecology

## 2021-11-16 ENCOUNTER — Ambulatory Visit (HOSPITAL_BASED_OUTPATIENT_CLINIC_OR_DEPARTMENT_OTHER): Payer: Medicaid Other | Admitting: Maternal & Fetal Medicine

## 2021-11-16 ENCOUNTER — Ambulatory Visit: Payer: Medicaid Other | Attending: Obstetrics and Gynecology

## 2021-11-16 ENCOUNTER — Other Ambulatory Visit: Payer: Self-pay | Admitting: Obstetrics and Gynecology

## 2021-11-16 ENCOUNTER — Ambulatory Visit (INDEPENDENT_AMBULATORY_CARE_PROVIDER_SITE_OTHER): Payer: Medicaid Other | Admitting: Obstetrics and Gynecology

## 2021-11-16 VITALS — BP 118/80 | HR 76 | Wt 118.0 lb

## 2021-11-16 DIAGNOSIS — Z363 Encounter for antenatal screening for malformations: Secondary | ICD-10-CM | POA: Diagnosis not present

## 2021-11-16 DIAGNOSIS — Z3A19 19 weeks gestation of pregnancy: Secondary | ICD-10-CM | POA: Insufficient documentation

## 2021-11-16 DIAGNOSIS — O358XX0 Maternal care for other (suspected) fetal abnormality and damage, not applicable or unspecified: Secondary | ICD-10-CM

## 2021-11-16 DIAGNOSIS — Z3490 Encounter for supervision of normal pregnancy, unspecified, unspecified trimester: Secondary | ICD-10-CM

## 2021-11-16 DIAGNOSIS — Q614 Renal dysplasia: Secondary | ICD-10-CM | POA: Diagnosis not present

## 2021-11-16 DIAGNOSIS — O0932 Supervision of pregnancy with insufficient antenatal care, second trimester: Secondary | ICD-10-CM
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
## 2021-11-16 NOTE — Progress Notes (Signed)
MFM Consult Note Patient Name: Mandy Eaton  Patient MRN:   [id number]  Referring provider: [facility]  Reason for Consult: MCKD   HPI: Mandy Eaton is a 26 y.o. [id number] at 23w0ddated by today's [place](LMP unreliable) here for ultrasound and consultation.   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  There are multiple echogenic cysts throughout the kidney with parenchymal compression.  The kidney is also enlarged due to the presence of these cysts.  There is a normal-appearing bladder ureters.  There is no sign of obstructive uropathy.  The contralateral kidney appears normal.  There is normal amniotic fluid.  There is no association of other nonrenal abnormalities.  She has not had genetic screening yet to this point.   Multicystic dysplastic kidneys are usually a nonhereditary cystic renal disease where normal kidney tissue was replaced by cysts surrounded by abnormally functioning renal parenchyma.  It occurs in up to 1 and 4000 live births when unilateral and up to 1 and 12,000 live births when bilaterally.  Males are more commonly affected in females with a ratio of 2:1 but females are more likely to have bilateral disease and other abnormalities.  The exact cause is unknown but is likely result of abnormal embryogenesis of the developing kidney with a subsequent development of multiple cysts and nonfunctioning kidneys.  There are multiple gene mutations associated with multicystic kidney disease that are usually spontaneous.  I discussed the ultrasound features consisting of noncommunicating echolucent cysts within the kidney with intervening parenchyma that appear mildly echogenic due to compression and dysplasia.  Affected kidneys are also usually enlarged.  The normal reniform outline of the kidney is lost.  The bladder and ureters are typically normal making the diagnosis of obstruction less likely.  Also be appearance on ultrasound  can change over time.    Up to 1/4 can be affected by associated anomalies and genetic abnormalities but the majority are isolated.  I discussed the typical pregnancy course including serial ultrasounds for growth, fetal echocardiogram to rule heart defects, amniocentesis for chromosomal MicroArray, pediatric neonatology and urology consultation and delivery at a tertiary care center.  I discussed that after birth typically the newborn is assessed for kidney function and structure with a renal ultrasound and voiding cystourethrogram.  If reflux is present antibiotics are typically given.  Pediatric urology consultation is done with renal follow-up on ultrasound in 3 to 650-monthntervals.  Surgical removal of the nonfunctioning kidney to reduce the incidence of hypertension is controversial and an individualized approach is usually done for severely dysplastic kidneys.  Review of Systems: A review of systems was performed and was negative except per HPI   Vitals and Physical Exam See intake sheet for vitals Sitting comfortably on the sonogram table Nonlabored breathing Normal rate and rhythm Abdomen is nontender  Genetic testing: Not yet done  Sonographic findings Single intrauterine pregnancy. Observed fetal cardiac activity. Cephalic presentation. 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  There are multiple echogenic cysts throughout the kidney with parenchymal compression.  The kidney is also enlarged due to the presence of these cysts.  There is a normal-appearing bladder ureters.  There is no sign of obstructive uropathy.  The contralateral kidney appears normal.  There is normal amniotic fluid.  There is no association of other nonrenal abnormalities.  She has not had genetic screening yet to this point. The remainder of anatomy that was seen appears normal.  Fetal biometry shows the estimated fetal weight at the 87 percentile.  Amniotic fluid  volume: Within normal limits. Placenta: Anterior. Cervix: Normal appearance by transabdominal scan. with a cervical length of 3.2 cm. Adnexa: No abnormality visualized.  Assessment - Multicystic dysplastic kidney disease (right) Plan -Serial growth ultrasounds -Fetal echo referral placed at [facility] -Consultation with pediatric subspecialties either during the pregnancy or after birth pending upon the clinical picture throughout her pregnancy -Patient to be added to the [facility] list -Delivery at a tertiary care center -Delivery route and timing per typical obstetric indication -Urologic imaging to be obtained after birth with pediatric urology consultation   I spent 50 minutes reviewing the patients chart, including labs and images as well as counseling the patient about her medical conditions.  Mandy Eaton  MFM, [place]   11/16/2021  4:13 PM

## 2021-12-12 ENCOUNTER — Other Ambulatory Visit: Payer: Self-pay | Admitting: *Deleted

## 2021-12-12 DIAGNOSIS — O283 Abnormal ultrasonic finding on antenatal screening of mother: Secondary | ICD-10-CM

## 2021-12-12 DIAGNOSIS — O0930 Supervision of pregnancy with insufficient antenatal care, unspecified trimester: Secondary | ICD-10-CM

## 2021-12-20 ENCOUNTER — Ambulatory Visit: Payer: Medicaid Other | Attending: Maternal & Fetal Medicine

## 2021-12-20 ENCOUNTER — Ambulatory Visit: Payer: Medicaid Other

## 2023-01-30 ENCOUNTER — Other Ambulatory Visit: Payer: Self-pay

## 2023-01-30 ENCOUNTER — Emergency Department (HOSPITAL_BASED_OUTPATIENT_CLINIC_OR_DEPARTMENT_OTHER)
Admission: EM | Admit: 2023-01-30 | Discharge: 2023-01-30 | Disposition: A | Discharge status: Home / Self Care | Payer: Medicaid Other | Attending: Emergency Medicine | Admitting: Emergency Medicine

## 2023-01-30 DIAGNOSIS — N898 Other specified noninflammatory disorders of vagina: Secondary | ICD-10-CM | POA: Diagnosis present

## 2023-01-30 DIAGNOSIS — N760 Acute vaginitis: Secondary | ICD-10-CM | POA: Insufficient documentation

## 2023-01-30 DIAGNOSIS — B9689 Other specified bacterial agents as the cause of diseases classified elsewhere: Secondary | ICD-10-CM | POA: Diagnosis not present

## 2023-01-30 LAB — WET PREP, GENITAL
Sperm: NONE SEEN
Trich, Wet Prep: NONE SEEN
WBC, Wet Prep HPF POC: <10 (ref <10)
Yeast Wet Prep HPF POC: NONE SEEN

## 2023-01-30 LAB — URINALYSIS, ROUTINE W REFLEX MICROSCOPIC
Bacteria, UA: NONE SEEN
Bilirubin Urine: NEGATIVE
Glucose, UA: NEGATIVE mg/dL
Hgb urine dipstick: NEGATIVE
Ketones, ur: NEGATIVE mg/dL
Nitrite: NEGATIVE
Specific Gravity, Urine: 1.036 — ABNORMAL HIGH (ref 1.005–1.030)
pH: 5.5 (ref 5.0–8.0)

## 2023-01-30 LAB — PREGNANCY, URINE: Preg Test, Ur: NEGATIVE

## 2023-01-30 MED ORDER — METRONIDAZOLE 500 MG PO TABS
500.0000 mg | ORAL_TABLET | Freq: Once | ORAL | Status: AC
  Administered 2023-01-30: 500 mg via ORAL
  Filled 2023-01-30: qty 1

## 2023-01-30 MED ORDER — METRONIDAZOLE 500 MG PO TABS: 500.0000 mg | ORAL_TABLET | Freq: Two times a day (BID) | ORAL | 0 refills | Status: AC

## 2023-01-30 NOTE — ED Notes (Signed)
Lab called to add HSV culture

## 2023-01-30 NOTE — Discharge Instructions (Addendum)
As discussed, you're positive for bacterial vaginosis. You have received the first dose of antibiotics in the ED tonight. I have sent a prescription to your pharmacy as well. Take Metronidazole 2x a day for the next 7 days. It will take 2-3 days for the medication to take effect. Do not drink alcohol while taking this medication as it can cause GI upset.  The other swabs are send out and will come back in the next several days. If any of those are positive you will receive a call from the hospital and need to come back for treatment.  Follow up with your PCP in the next 5-7 days for reevaluation. If you do not have one, there's a phone number in the discharge paperwork you can call that will help you find one.  Return to the ED if your symptoms worsen in the interim.

## 2023-01-30 NOTE — ED Provider Notes (Incomplete)
[place] [facility]GENC[facility] DEPARTMENT AT [facility] Provider Note   CSN: [id number] Arrival date & time: 01/30/23  1854     History {Add pertinent medical, surgical, social history, OB history to HPI:1} Chief Complaint  Patient presents with  . Vaginal Discharge  . Dysuria    Mandy Eaton is a 27 y.o. female with no significant past medical history presents the ED today for vaginal discharge.  Patient reports 5-day history of yellow and malodorous vaginal discharge.  She endorses pain with urination since yesterday but denies hematuria or vaginal bleeding.  No abdominal pain, back pain, nausea, vomiting or fever.    Home Medications Prior to Admission medications   Medication Sig Start Date End Date Taking? Authorizing Provider  Blood Pressure Monitoring (BLOOD PRESSURE KIT) DEVI 1 Device by Does not apply route as needed. Patient not taking: Reported on 11/16/2021 09/13/21   Warden Fillers, MD  famotidine (PEPCID) 20 MG tablet Take 1 tablet (20 mg total) by mouth 2 (two) times daily as needed for heartburn or indigestion. Patient not taking: Reported on 11/16/2021 08/23/21   Hurshel Party, CNM  Misc. Devices (GOJJI WEIGHT SCALE) MISC 1 Device by Does not apply route as needed. Patient not taking: Reported on 11/16/2021 09/13/21   Warden Fillers, MD  ondansetron (ZOFRAN-ODT) 4 MG disintegrating tablet Take 1 tablet (4 mg total) by mouth every 6 (six) hours as needed for nausea. Patient not taking: Reported on 11/16/2021 10/16/21   [place] Bing, MD  Prenatal Vit-Fe Phos-FA-Omega (VITAFOL GUMMIES) 3.33-0.333-34.8 MG CHEW Chew 3 tablets by mouth daily. Patient not taking: Reported on 11/16/2021 04/21/21   Leftwich-Kirby, Wilmer Floor, CNM  promethazine (PHENERGAN) 12.5 MG tablet Take 1 tablet (12.5 mg total) by mouth every 6 (six) hours as needed for nausea or vomiting. Patient not taking: Reported on 11/16/2021 09/29/21   Gerrit Heck, CNM  scopolamine (TRANSDERM-SCOP) 1 MG/3DAYS  Place 1 patch (1.5 mg total) onto the skin every 3 (three) days. Patient not taking: Reported on 11/16/2021 10/05/21   Aviva Signs, CNM      Allergies    Patient has no known allergies.    Review of Systems   Review of Systems  Genitourinary:  Positive for dysuria and vaginal discharge.  All other systems reviewed and are negative.   Physical Exam Updated Vital Signs BP (!) 108/94 (BP Location: Right Arm)   Pulse 100   Temp (!) 97.5 F (36.4 C)   Resp 14   Ht 5\' 3"  (1.6 m)   Wt 63.5 kg   LMP 01/22/2023 (Approximate)   SpO2 97%   BMI 24.80 kg/m  Physical Exam Vitals and nursing note reviewed. Exam conducted with a chaperone present.  Constitutional:      Appearance: Normal appearance.  HENT:     Head: Normocephalic and atraumatic.     Mouth/Throat:     Mouth: Mucous membranes are moist.  Eyes:     Conjunctiva/sclera: Conjunctivae normal.     Pupils: Pupils are equal, round, and reactive to light.  Cardiovascular:     Rate and Rhythm: Normal rate and regular rhythm.     Pulses: Normal pulses.  Pulmonary:     Effort: Pulmonary effort is normal.     Breath sounds: Normal breath sounds.  Abdominal:     Palpations: Abdomen is soft.     Tenderness: There is no abdominal tenderness.  Genitourinary:    Vagina: Vaginal discharge present.     Comments: Malodorous, yellow discharge present.  Skin tear vs ulceration at the vulva - swab collected. Skin:    General: Skin is warm and dry.     Findings: No rash.  Neurological:     General: No focal deficit present.     Mental Status: She is alert.  Psychiatric:        Mood and Affect: Mood normal.        Behavior: Behavior normal.    ED Results / Procedures / Treatments   Labs (all labs ordered are listed, but only abnormal results are displayed) Labs Reviewed  URINALYSIS, ROUTINE W REFLEX MICROSCOPIC - Abnormal; Notable for the following components:      Result Value   Specific Gravity, Urine 1.036 (*)     Protein, ur TRACE (*)    Leukocytes,Ua SMALL (*)    All other components within normal limits  WET PREP, GENITAL  PREGNANCY, URINE  GC/CHLAMYDIA PROBE AMP ([place]) NOT AT [facility]    EKG None  Radiology No results found.  Procedures Procedures: not indicated. {Document cardiac monitor, telemetry assessment procedure when appropriate:1}  Medications Ordered in ED Medications - No data to display  ED Course/ Medical Decision Making/ A&P   {   Click here for ABCD2, HEART and other calculatorsREFRESH Note before signing :1}                              Medical Decision Making Amount and/or Complexity of Data Reviewed Labs: ordered.  Risk Prescription drug management.   This patient presents to the ED for concern of vaginal discharge, this involves an extensive number of treatment options, and is a complaint that carries with it a high risk of complications and morbidity.   Differential diagnosis includes: STI, BV, yeast infection, etc.   Comorbidities  See HPI above   Additional History  Additional history obtained from prior records.   Lab Tests  I ordered and personally interpreted labs.  The pertinent results include:   Negative pregnancy Wet prep positive for BV HSV, GC/Chlamydia testing pending.   Problem List / ED Course / Critical Interventions / Medication Management  *** I ordered medications including: *** for ***  Reevaluation of the patient after these medicines showed that the patient {resolved/improved/worsened:23923::"improved"} I have reviewed the patients home medicines and have made adjustments as needed   Social Determinants of Health  ***   Test / Admission - Considered  ***   {Document critical care time when appropriate:1} {Document review of labs and clinical decision tools ie heart score, Chads2Vasc2 etc:1}  {Document your independent review of radiology images, and any outside records:1} {Document your discussion with  family members, caretakers, and with consultants:1} {Document social determinants of health affecting pt's care:1} {Document your decision making why or why not admission, treatments were needed:1} Final Clinical Impression(s) / ED Diagnoses Final diagnoses:  None    Rx / DC Orders ED Discharge Orders     None

## 2023-01-30 NOTE — ED Provider Notes (Signed)
[place] EMERGENCY DEPARTMENT AT [facility] Provider Note   CSN: [id number] Arrival date & time: 01/30/23  1854     History {Add pertinent medical, surgical, social history, OB history to HPI:1} Chief Complaint  Patient presents with  . Vaginal Discharge  . Dysuria    Mandy Eaton is a 27 y.o. female with no significant past medical history presents the ED today for vaginal discharge.  Patient reports 5-day history of yellow and malodorous vaginal discharge.  She endorses pain with urination since yesterday but denies hematuria or vaginal bleeding.  No abdominal pain, back pain, nausea, vomiting or fever.    Home Medications Prior to Admission medications   Medication Sig Start Date End Date Taking? Authorizing Provider  Blood Pressure Monitoring (BLOOD PRESSURE KIT) DEVI 1 Device by Does not apply route as needed. Patient not taking: Reported on 11/16/2021 09/13/21   Warden Fillers, MD  famotidine (PEPCID) 20 MG tablet Take 1 tablet (20 mg total) by mouth 2 (two) times daily as needed for heartburn or indigestion. Patient not taking: Reported on 11/16/2021 08/23/21   Hurshel Party, CNM  Misc. Devices (GOJJI WEIGHT SCALE) MISC 1 Device by Does not apply route as needed. Patient not taking: Reported on 11/16/2021 09/13/21   Warden Fillers, MD  ondansetron (ZOFRAN-ODT) 4 MG disintegrating tablet Take 1 tablet (4 mg total) by mouth every 6 (six) hours as needed for nausea. Patient not taking: Reported on 11/16/2021 10/16/21   [place] Bing, MD  Prenatal Vit-Fe Phos-FA-Omega (VITAFOL GUMMIES) 3.33-0.333-34.8 MG CHEW Chew 3 tablets by mouth daily. Patient not taking: Reported on 11/16/2021 04/21/21   Leftwich-Kirby, Wilmer Floor, CNM  promethazine (PHENERGAN) 12.5 MG tablet Take 1 tablet (12.5 mg total) by mouth every 6 (six) hours as needed for nausea or vomiting. Patient not taking: Reported on 11/16/2021 09/29/21   Gerrit Heck, CNM  scopolamine (TRANSDERM-SCOP) 1 MG/3DAYS  Place 1 patch (1.5 mg total) onto the skin every 3 (three) days. Patient not taking: Reported on 11/16/2021 10/05/21   Aviva Signs, CNM      Allergies    Patient has no known allergies.    Review of Systems   Review of Systems  Genitourinary:  Positive for dysuria and vaginal discharge.  All other systems reviewed and are negative.   Physical Exam Updated Vital Signs BP (!) 108/94 (BP Location: Right Arm)   Pulse 100   Temp (!) 97.5 F (36.4 C)   Resp 14   Ht 5\' 3"  (1.6 m)   Wt 63.5 kg   LMP 01/22/2023 (Approximate)   SpO2 97%   BMI 24.80 kg/m  Physical Exam Vitals and nursing note reviewed. Exam conducted with a chaperone present.  Constitutional:      Appearance: Normal appearance.  HENT:     Head: Normocephalic and atraumatic.     Mouth/Throat:     Mouth: Mucous membranes are moist.  Eyes:     Conjunctiva/sclera: Conjunctivae normal.     Pupils: Pupils are equal, round, and reactive to light.  Cardiovascular:     Rate and Rhythm: Normal rate and regular rhythm.     Pulses: Normal pulses.  Pulmonary:     Effort: Pulmonary effort is normal.     Breath sounds: Normal breath sounds.  Abdominal:     Palpations: Abdomen is soft.     Tenderness: There is no abdominal tenderness.  Genitourinary:    Vagina: Vaginal discharge present.     Comments: Malodorous, yellow discharge present  Skin:    General: Skin is warm and dry.     Findings: No rash.  Neurological:     General: No focal deficit present.     Mental Status: She is alert.  Psychiatric:        Mood and Affect: Mood normal.        Behavior: Behavior normal.   ED Results / Procedures / Treatments   Labs (all labs ordered are listed, but only abnormal results are displayed) Labs Reviewed  URINALYSIS, ROUTINE W REFLEX MICROSCOPIC - Abnormal; Notable for the following components:      Result Value   Specific Gravity, Urine 1.036 (*)    Protein, ur TRACE (*)    Leukocytes,Ua SMALL (*)    All other  components within normal limits  WET PREP, GENITAL  PREGNANCY, URINE  GC/CHLAMYDIA PROBE AMP ([place]) NOT AT [facility]    EKG None  Radiology No results found.  Procedures Procedures: not indicated. {Document cardiac monitor, telemetry assessment procedure when appropriate:1}  Medications Ordered in ED Medications - No data to display  ED Course/ Medical Decision Making/ A&P   {   Click here for ABCD2, HEART and other calculatorsREFRESH Note before signing :1}                              Medical Decision Making Amount and/or Complexity of Data Reviewed Labs: ordered.   This patient presents to the ED for concern of vaginal discharge, this involves an extensive number of treatment options, and is a complaint that carries with it a high risk of complications and morbidity.   Differential diagnosis includes: ***   Comorbidities  See HPI above   Additional History  Additional history obtained from prior records.   Lab Tests  I ordered and personally interpreted labs.  The pertinent results include:   Negative pregnancy    Problem List / ED Course / Critical Interventions / Medication Management  *** I ordered medications including: *** for ***  Reevaluation of the patient after these medicines showed that the patient {resolved/improved/worsened:23923::"improved"} I have reviewed the patients home medicines and have made adjustments as needed   Social Determinants of Health  ***   Test / Admission - Considered  ***   {Document critical care time when appropriate:1} {Document review of labs and clinical decision tools ie heart score, Chads2Vasc2 etc:1}  {Document your independent review of radiology images, and any outside records:1} {Document your discussion with family members, caretakers, and with consultants:1} {Document social determinants of health affecting pt's care:1} {Document your decision making why or why not admission, treatments were  needed:1} Final Clinical Impression(s) / ED Diagnoses Final diagnoses:  None    Rx / DC Orders ED Discharge Orders     None

## 2023-02-01 LAB — GC/CHLAMYDIA PROBE AMP (~~LOC~~) NOT AT ARMC
Chlamydia: NEGATIVE
Comment: NEGATIVE
Comment: NORMAL
Neisseria Gonorrhea: NEGATIVE

## 2023-02-03 LAB — HSV CULTURE AND TYPING

## 2023-03-06 IMAGING — US US OB COMP LESS 14 WK
1 series · 15 of 26 positions shown · non-contrast
Comparison: None.

CLINICAL DATA: Pain

EXAM:
OBSTETRIC <14 WK ULTRASOUND
TECHNIQUE: Transabdominal ultrasound was performed for evaluation of the
gestation as well as the maternal uterus and adnexal regions.

[Series 1: us ob comp less 14 wk · 15 of 26 slices shown]
[im 1/26]
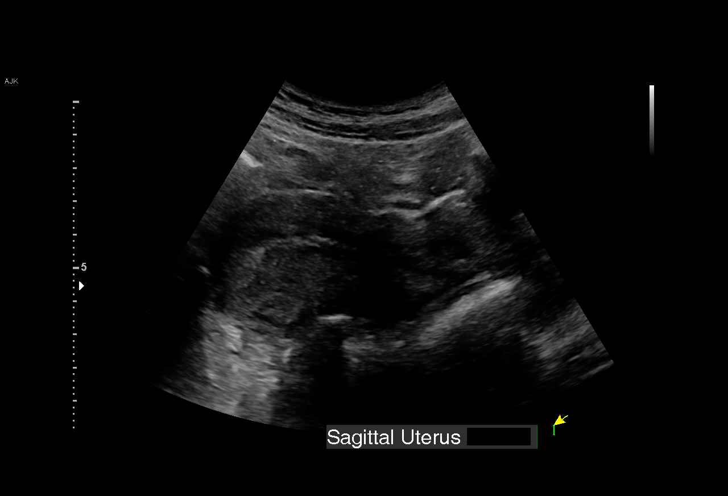
[im 3/26]
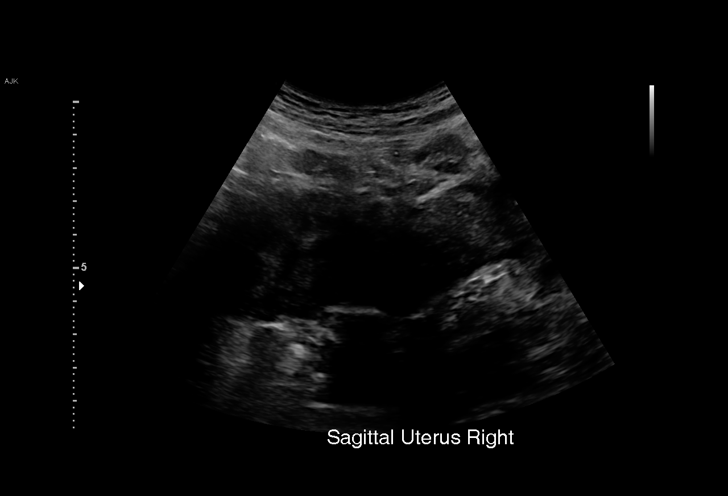
[im 5/26]
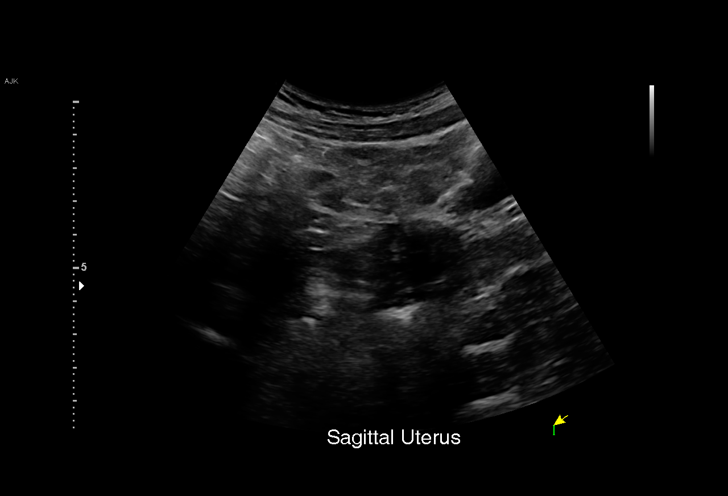
[im 7/26]
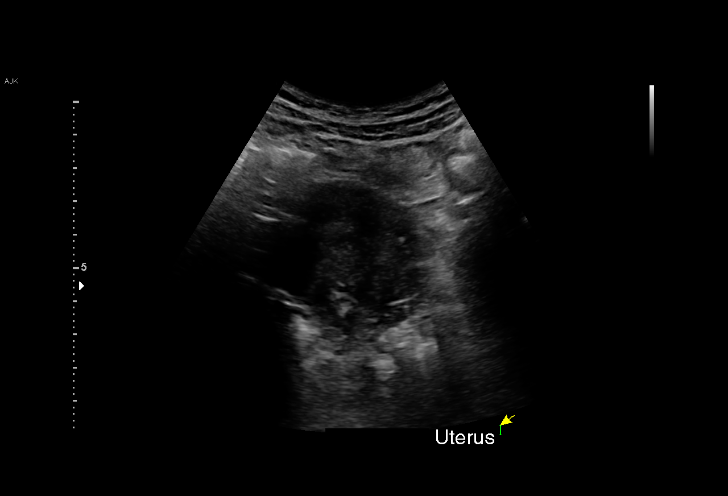
[im 8/26]
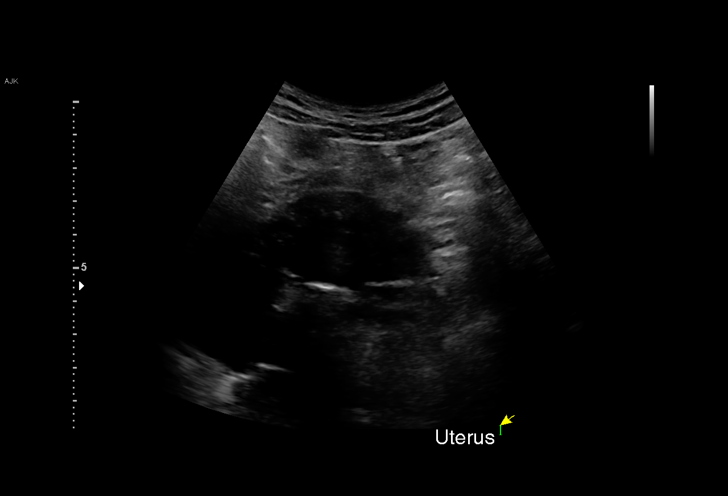
[im 10/26]
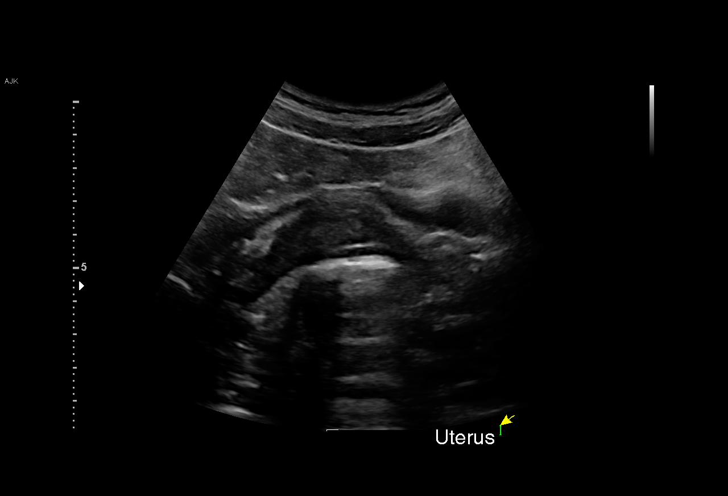
[im 12/26]
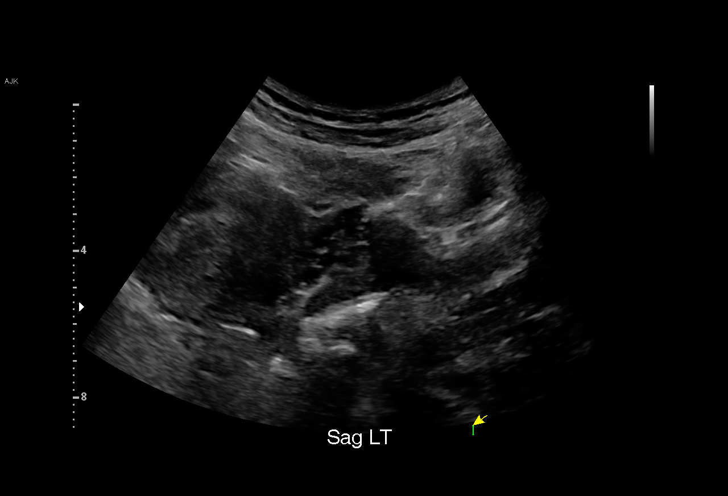
[im 14/26]
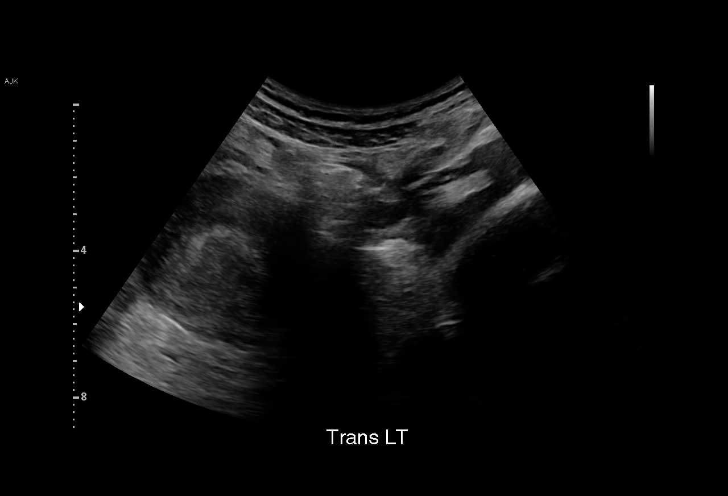
[im 15/26]
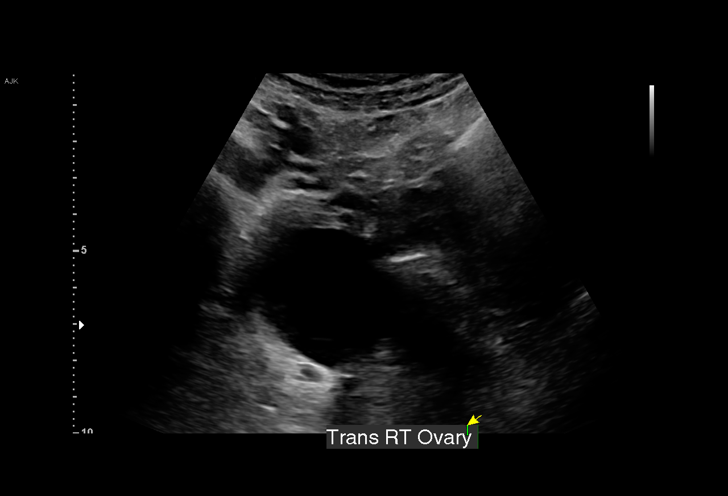
[im 17/26]
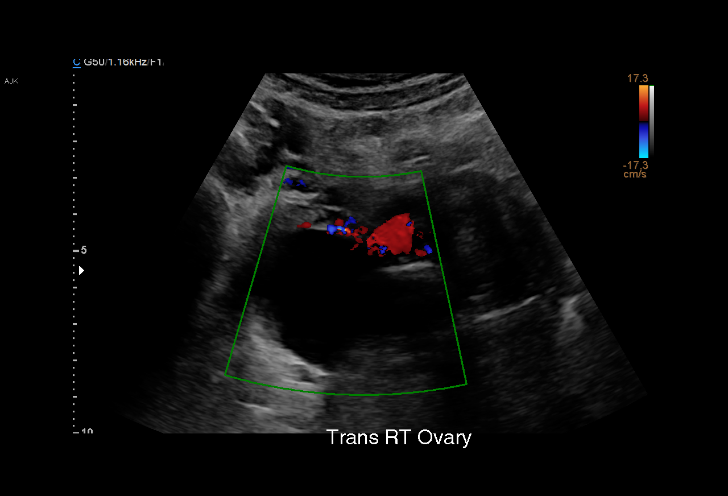
[im 19/26]
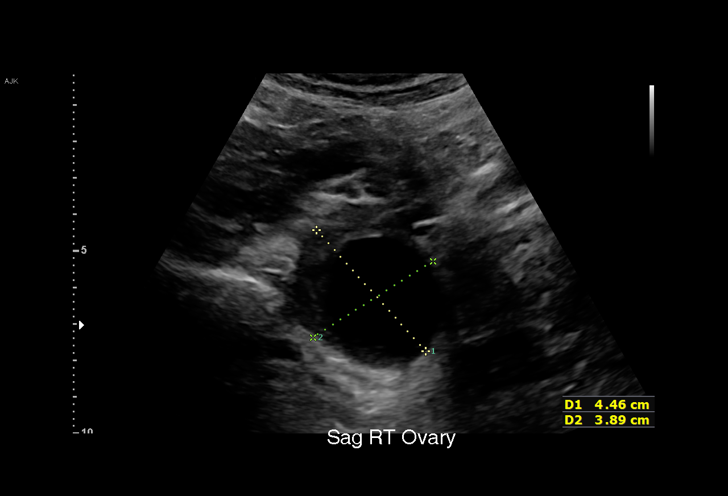
[im 20/26]
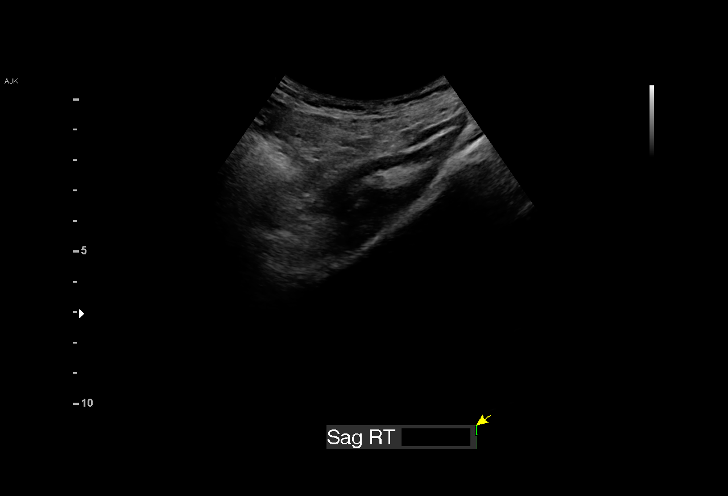
[im 22/26]
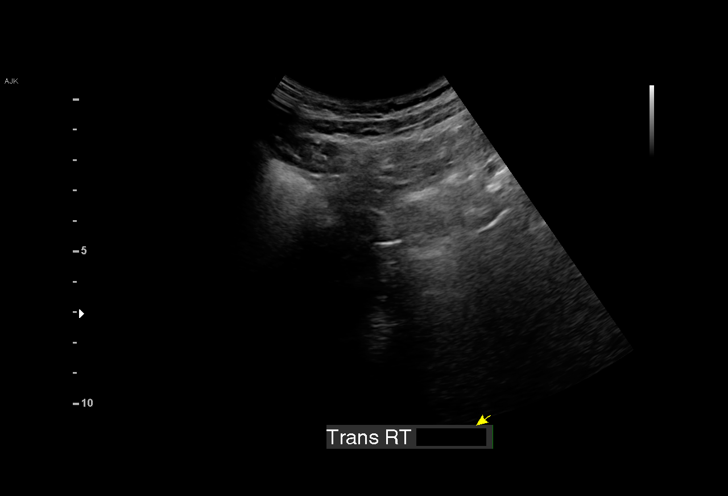
[im 24/26]
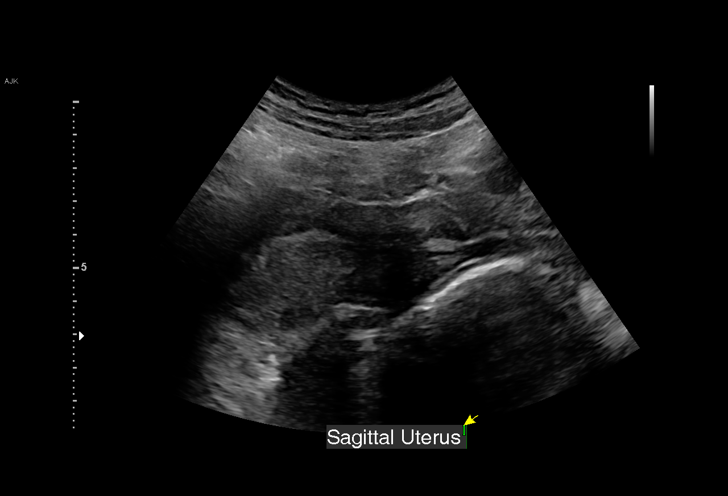
[im 26/26]
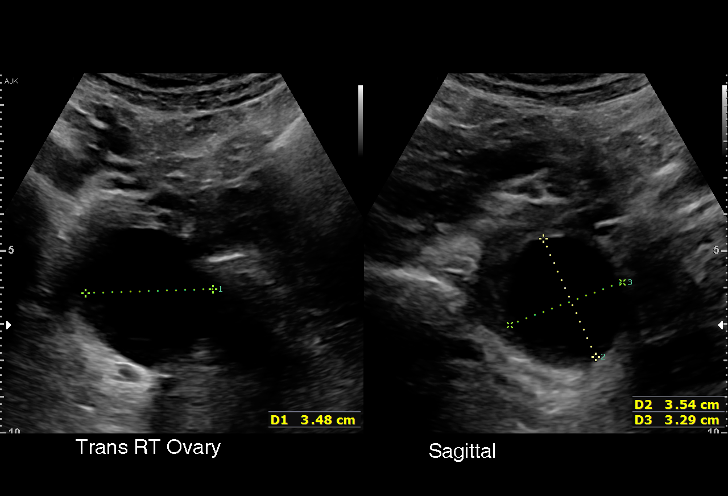

[15 of 26 positions shown; findings below may reference images not displayed]

FINDINGS: Intrauterine gestational sac: None

Yolk sac:  Not Visualized.

Embryo:  Not Visualized.

Cardiac Activity: Not Visualized.

Heart Rate:  bpm

MSD:    mm    w     d

CRL:     mm    w  d                  US EDC:

Subchorionic hemorrhage:  None visualized.

Maternal uterus/adnexae: 3.7 cm simple appearing cyst in the right
ovary. Left ovary not visualized. No adnexal mass. No free fluid.

Patient declined transvaginal imaging.
IMPRESSION: No intrauterine pregnancy visualized. Differential considerations
would include early intrauterine pregnancy too early to visualize,
spontaneous abortion, or occult ectopic pregnancy. Recommend close
clinical followup and serial quantitative beta HCGs and ultrasounds.

## 2023-03-09 IMAGING — US US OB TRANSVAGINAL
1 series · 15 of 28 positions shown · non-contrast
Comparison: None.

CLINICAL DATA: Vaginal bleeding

EXAM:
OBSTETRIC <14 WK US AND TRANSVAGINAL OB US
TECHNIQUE: Both transabdominal and transvaginal ultrasound examinations were
performed for complete evaluation of the gestation as well as the
maternal uterus, adnexal regions, and pelvic cul-de-sac.
Transvaginal technique was performed to assess early pregnancy.

[Series 1: us ob transvaginal · 15 of 32 slices shown]
[im 1/32]
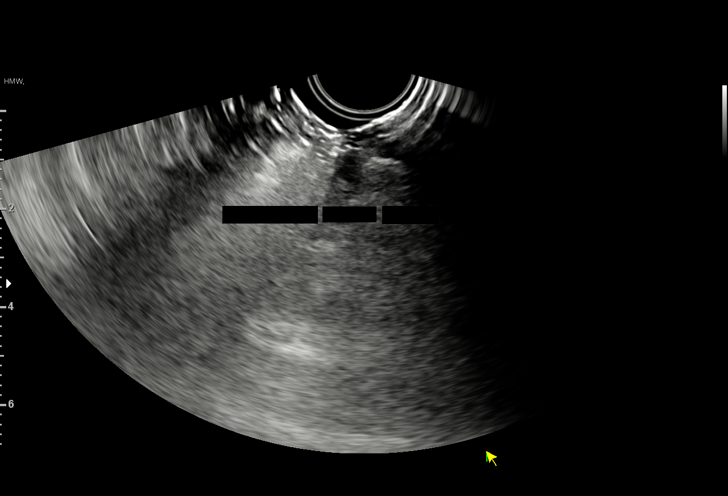
[im 3/32]
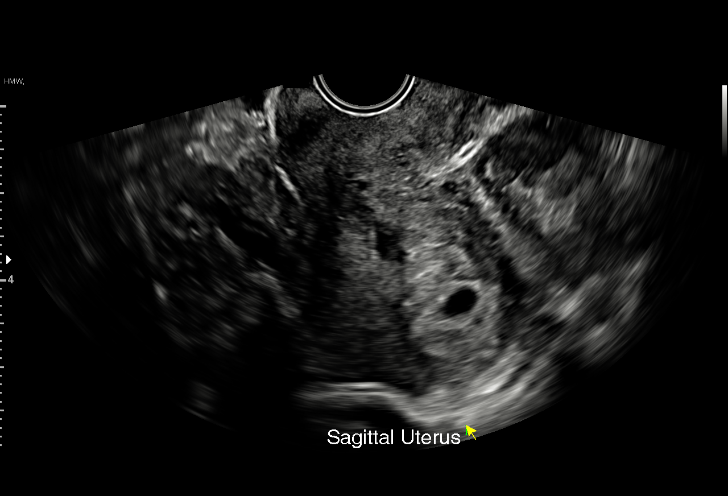
[im 5/32]
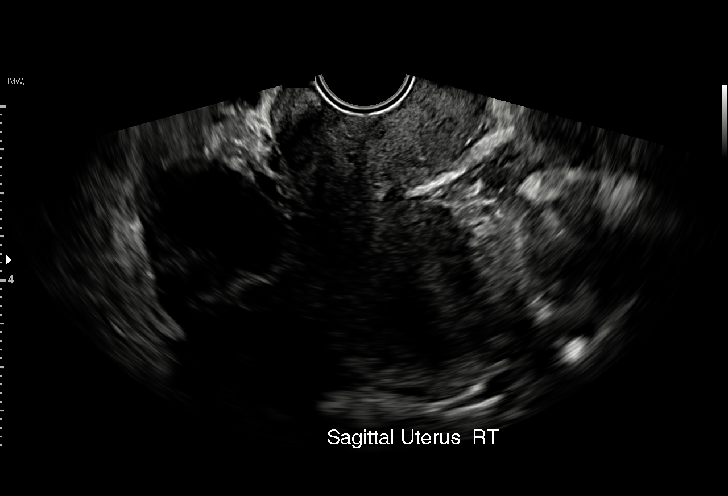
[im 7/32]
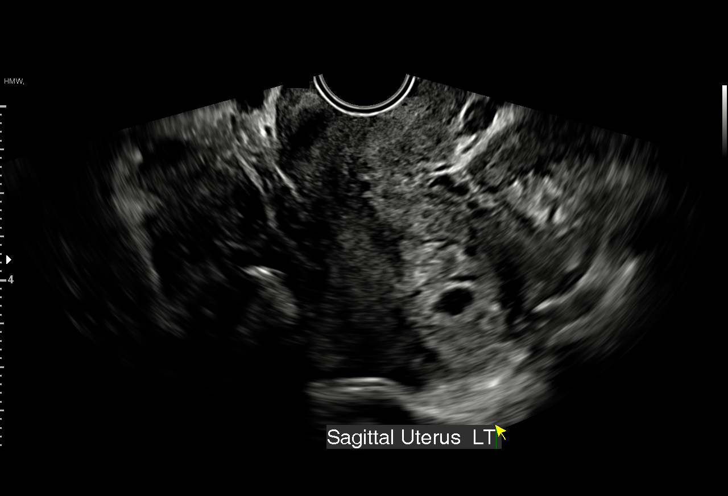
[im 10/32]
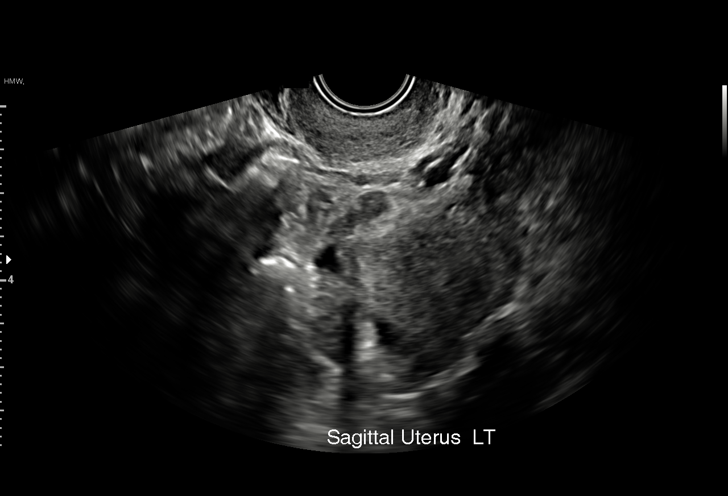
[im 12/32]
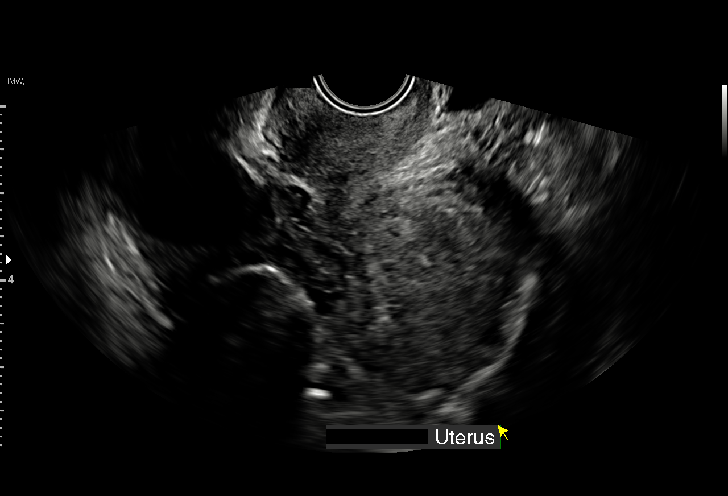
[im 14/32]
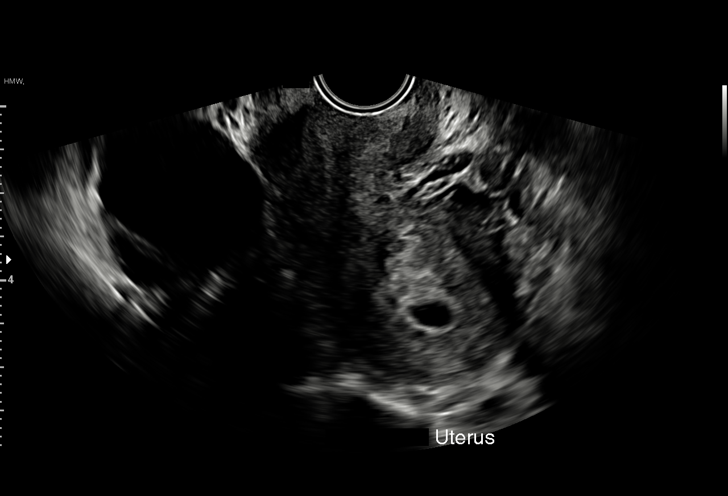
[im 17/32]
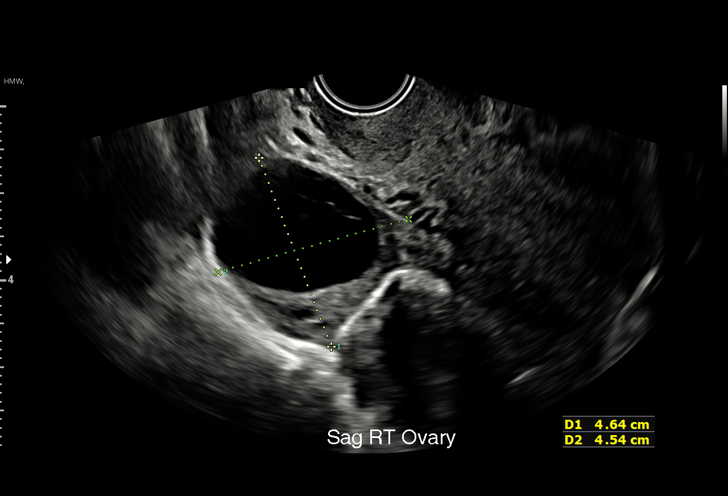
[im 18/32]
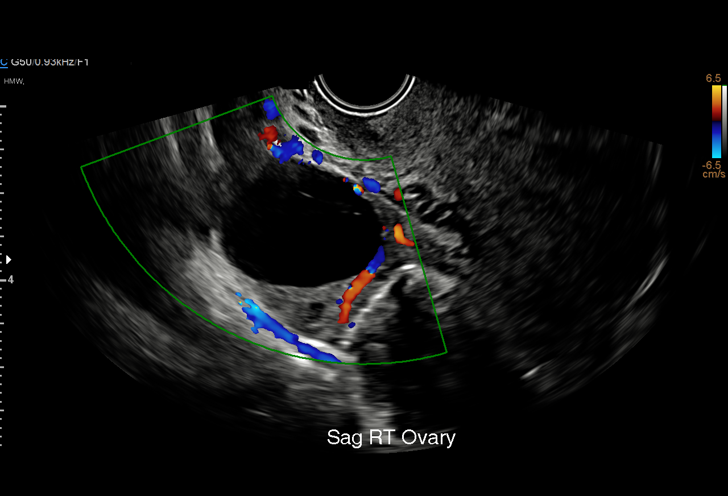
[im 20/32]
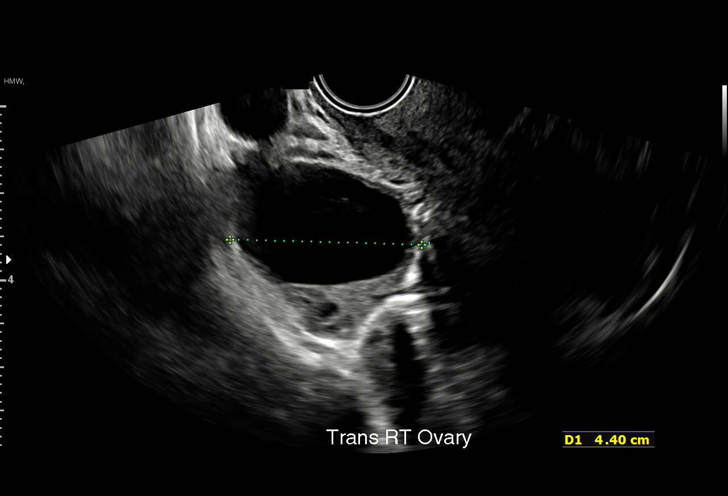
[im 22/32]
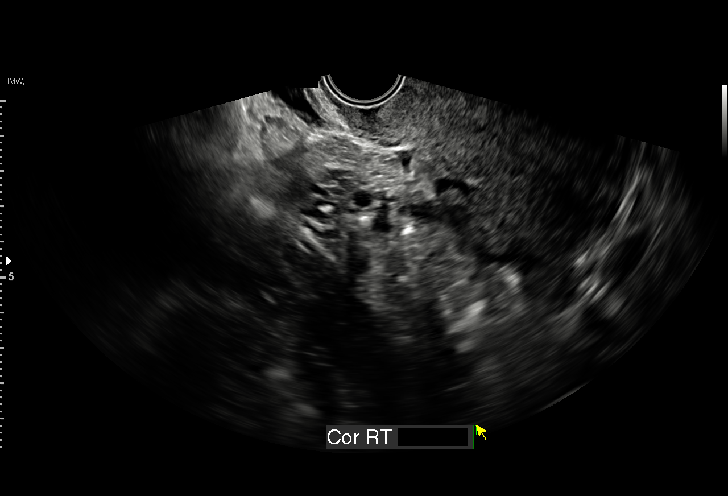
[im 25/32]
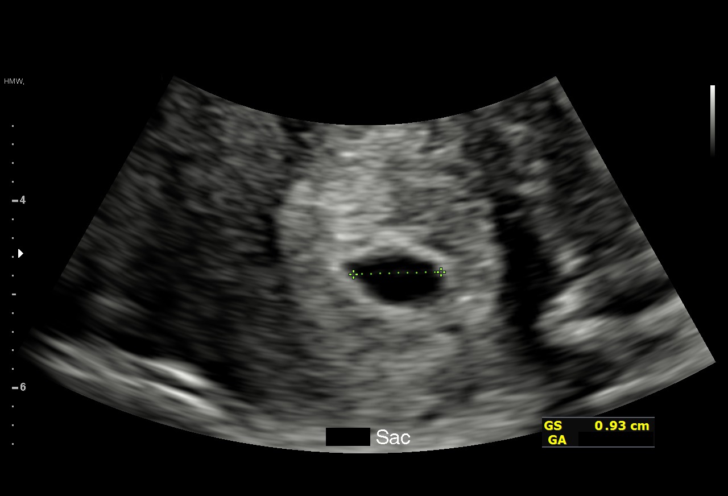
[im 27/32]
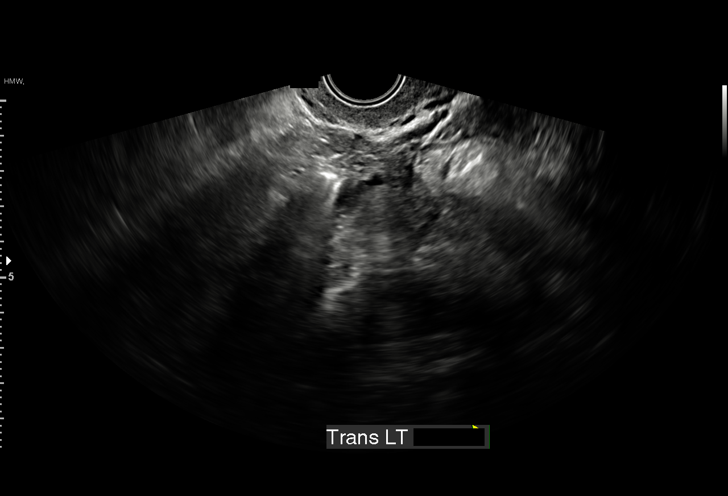
[im 29/32]
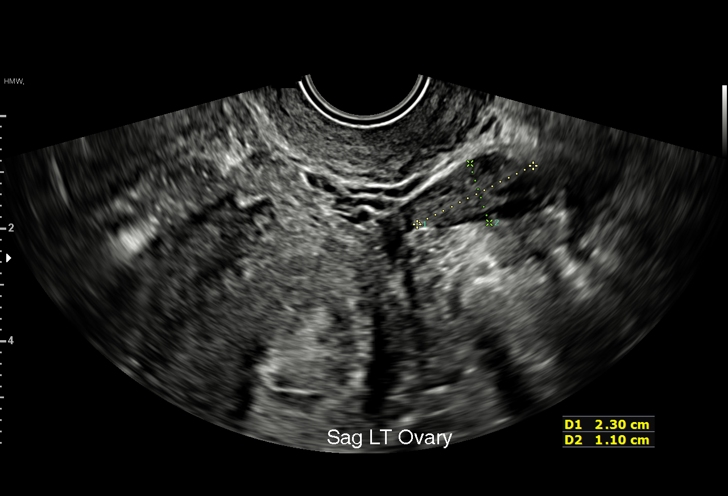
[im 32/32]
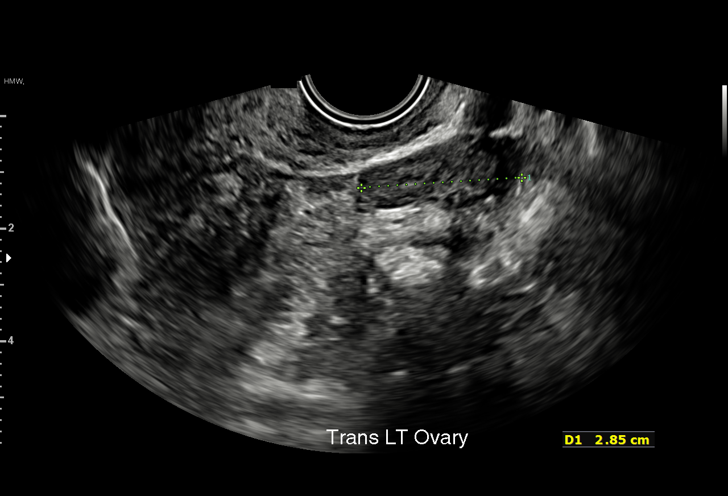

[15 of 28 positions shown; findings below may reference images not displayed]

FINDINGS: Intrauterine gestational sac: Single

Yolk sac:  Not Visualized.

Embryo:  Not Visualized.

Cardiac Activity: Not Visualized.

Heart Rate: NA

MSD: 8.5  mm   5 w   4  d

Subchorionic hemorrhage:  None visualized.

Maternal uterus/adnexae: Normal appearance of the left ovary. Cyst
of the right ovary measuring up to 4.3 cm, likely a dominant
follicle. No free fluid in the pelvis.
IMPRESSION: Probable early intrauterine gestational sac, but no yolk sac, fetal
pole, or cardiac activity yet visualized. Recommend follow-up
quantitative B-HCG levels and follow-up US in 14 days to assess
viability. This recommendation follows SRU consensus guidelines:
Diagnostic Criteria for Nonviable Pregnancy Early in the First
Trimester. N Engl J Med 5369; [DATE].

## 2023-03-17 IMAGING — US US OB TRANSVAGINAL
1 series · 15 of 28 positions shown · non-contrast
Comparison: None.

CLINICAL DATA: Discharge and cramping.

EXAM:
OBSTETRIC <14 WK US AND TRANSVAGINAL OB US
TECHNIQUE: Both transabdominal and transvaginal ultrasound examinations were
performed for complete evaluation of the gestation as well as the
maternal uterus, adnexal regions, and pelvic cul-de-sac.
Transvaginal technique was performed to assess early pregnancy.

[Series 1: us ob transvaginal · 51 acquisitions, 15 frames shown]
[im 1/51]
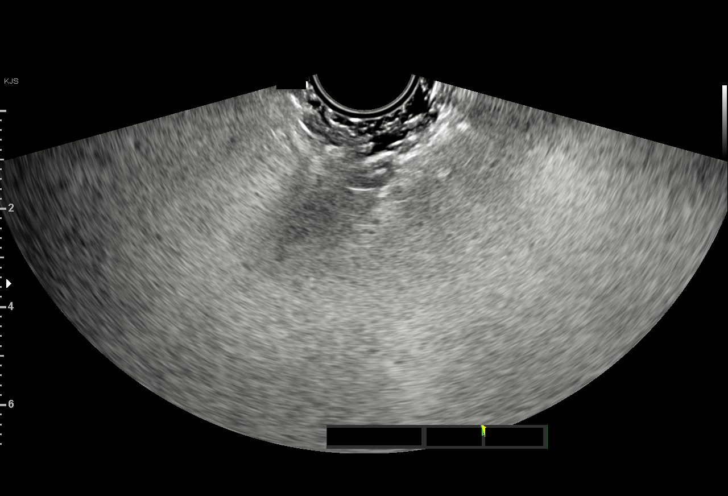
[im 4/51]
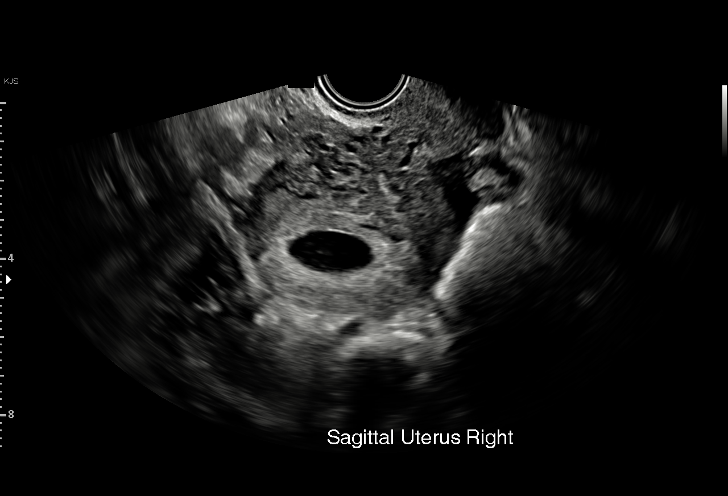
[im 8/51]
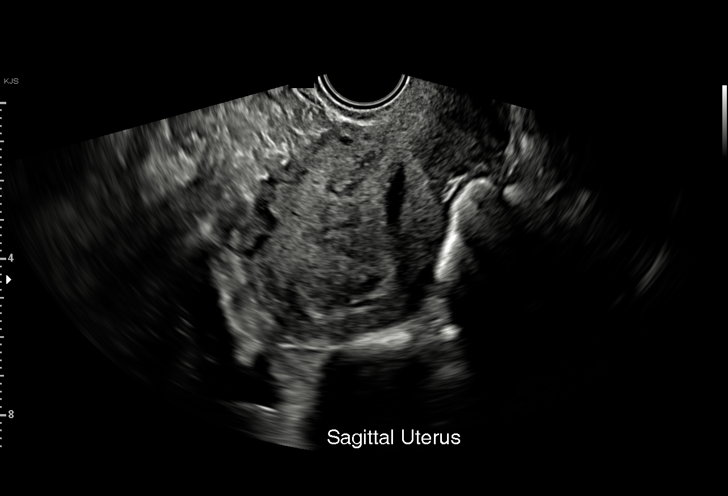
[im 12/51]
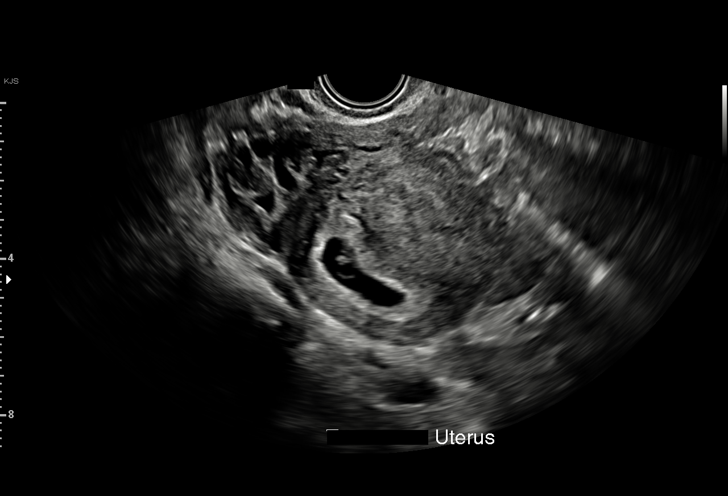
[im 15/51]
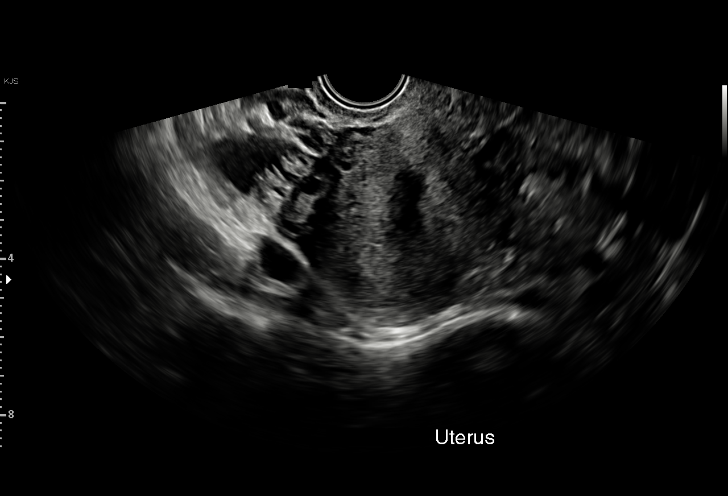
[im 19/51]
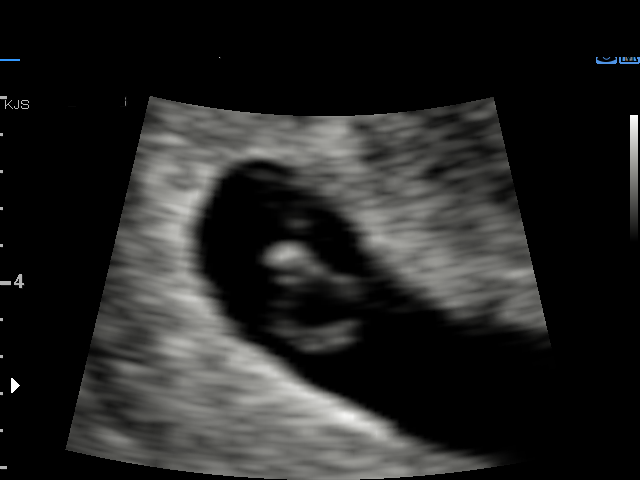
[im 23/51]
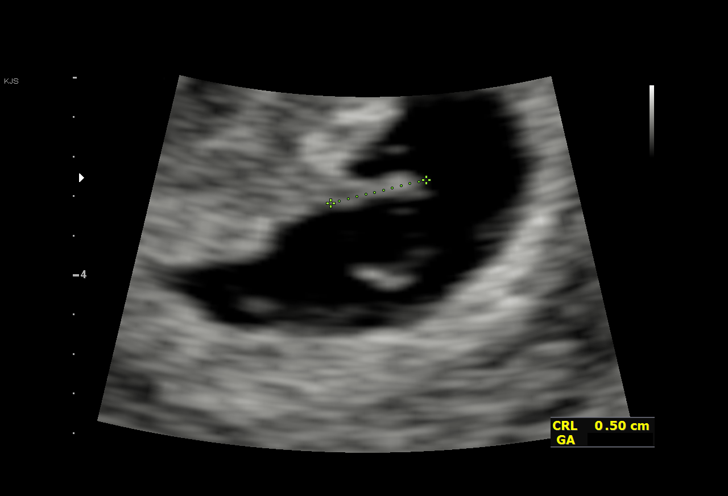
[im 26/51]
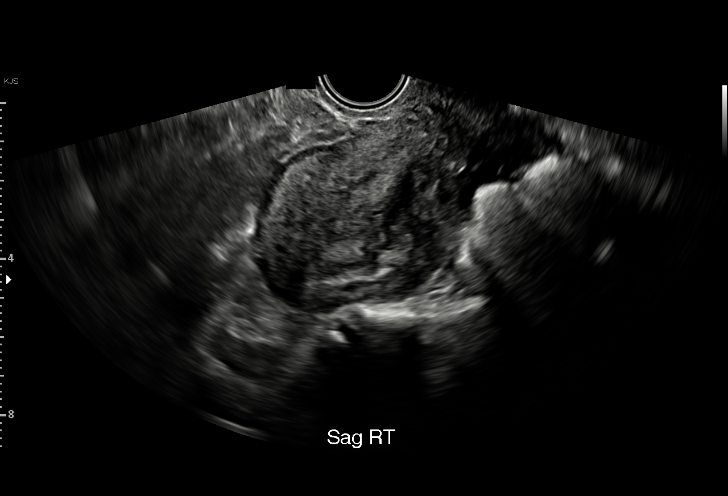
[im 28/51]
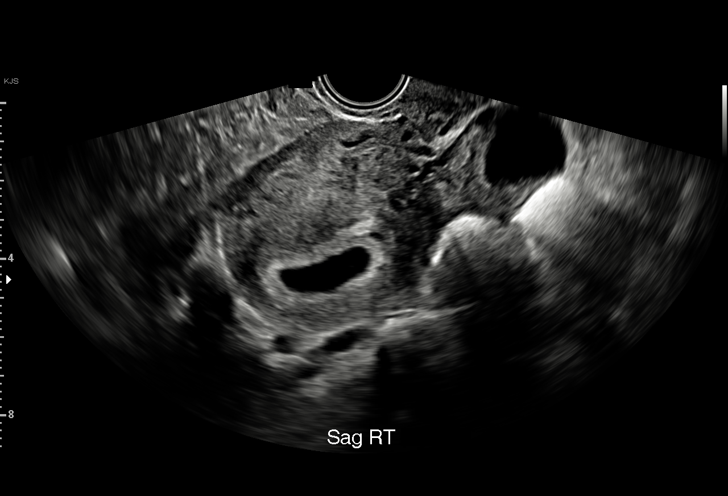
[im 32/51]
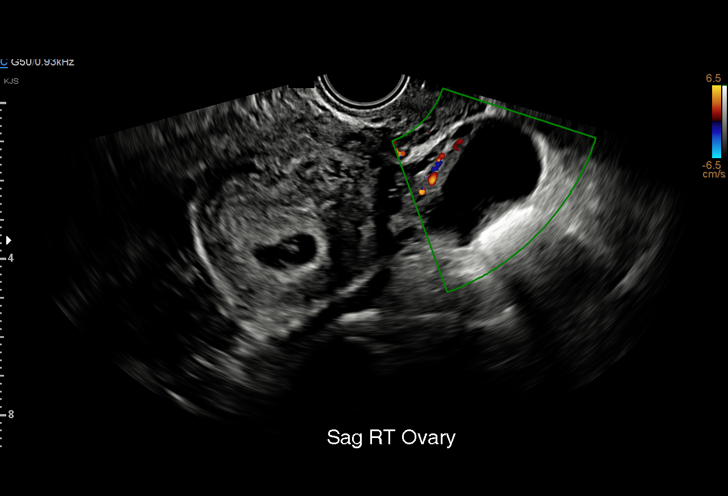
[im 36/51]
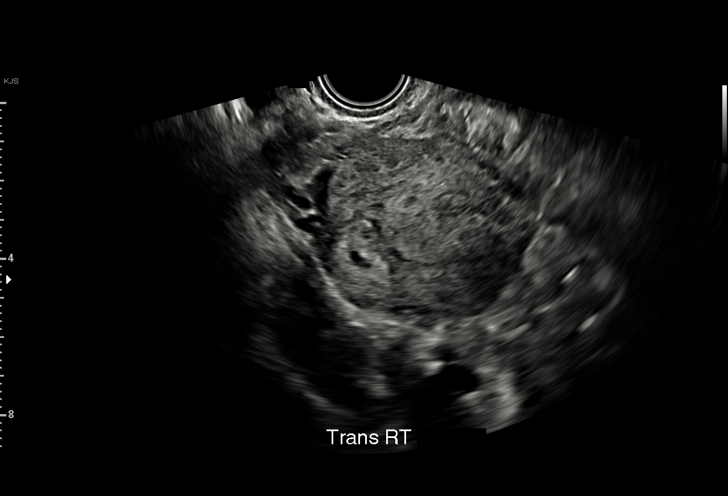
[im 39/51]
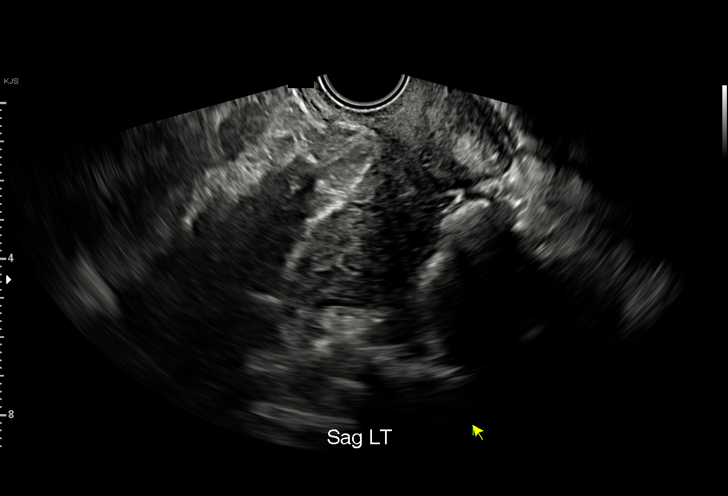
[im 43/51]
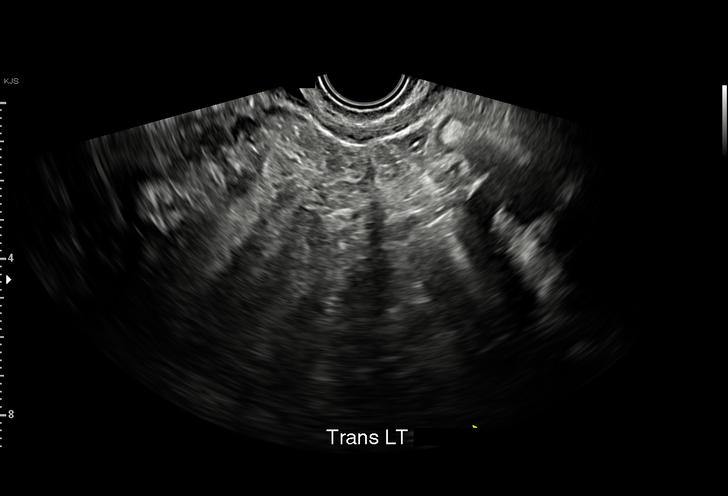
[im 47/51]
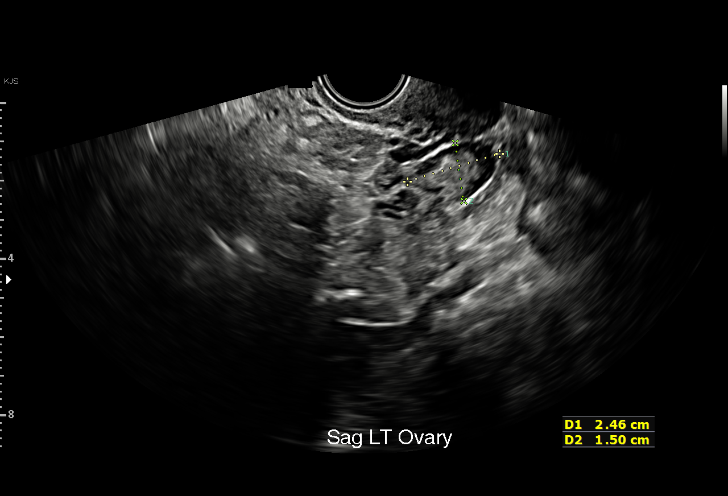
[im 51/51]
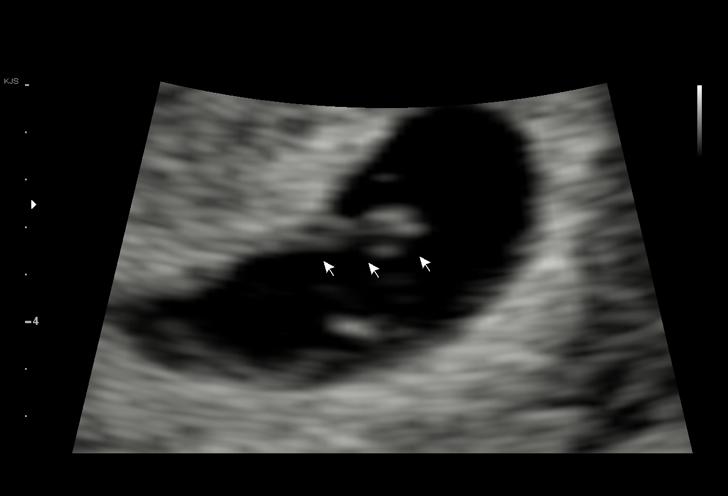

[15 of 28 positions shown; findings below may reference images not displayed]

FINDINGS: Intrauterine gestational sac: Single

Yolk sac:  Visualized.

Embryo:  Visualized.

Cardiac Activity: Visualized.

Heart Rate: 98 bpm

CRL:  4.9 mm   6 w   1 d                  US EDC: 12/26/2010.

Subchorionic hemorrhage:  None visualized.

Maternal uterus/adnexae: Simple right ovarian cyst identified
measuring up to 3.5 cm. Left ovary within normal limits. Small
amount of free fluid in the pelvis.
IMPRESSION: 1. Single live intrauterine gestation measuring 6 weeks 1 day by
crown-rump length.
2. Fetal heart rate is lower limits of normal. Consider short-term
follow-up ultrasound in 2 weeks to confirm viability.
3. 3.5 cm right ovarian cyst.
4. Small amount of free fluid in the pelvis.

## 2023-04-01 IMAGING — US US OB < 14 WEEKS - US OB TV
1 series · 15 of 28 positions shown · non-contrast
Comparison: Such ultrasound 05/02/2021

CLINICAL DATA: Viability scan

EXAM:
OBSTETRIC <14 WK US AND TRANSVAGINAL OB US
TECHNIQUE: Both transabdominal and transvaginal ultrasound examinations were
performed for complete evaluation of the gestation as well as the
maternal uterus, adnexal regions, and pelvic cul-de-sac.
Transvaginal technique was performed to assess early pregnancy.

[Series 1: us ob < 14 weeks - us ob tv · 15 of 45 slices shown]
[im 1/45]
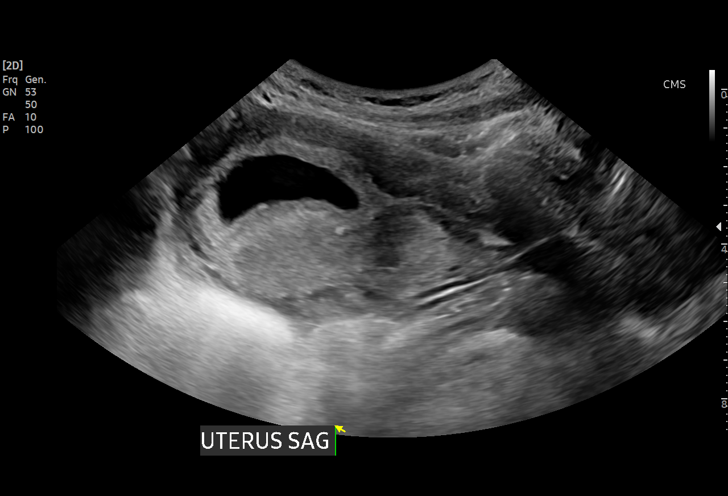
[im 4/45]
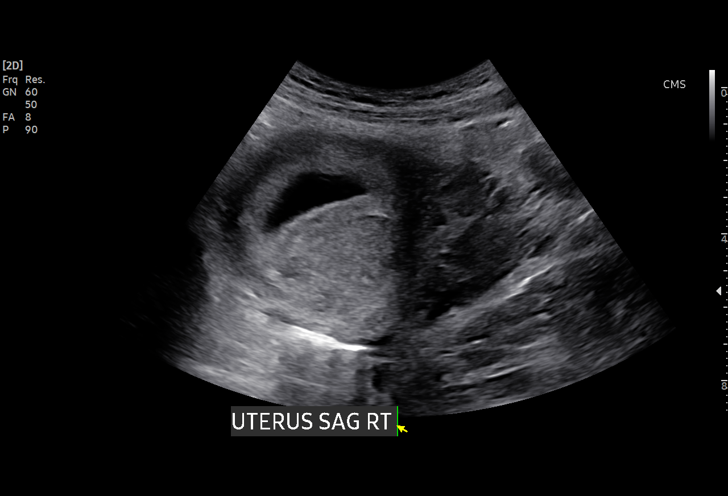
[im 7/45]
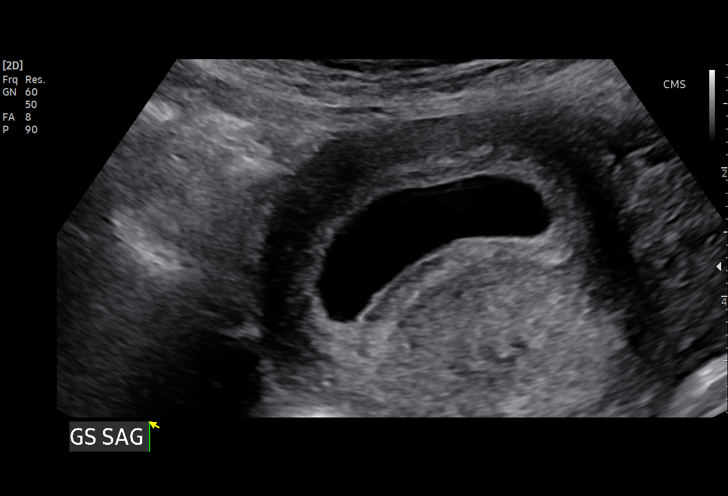
[im 10/45]
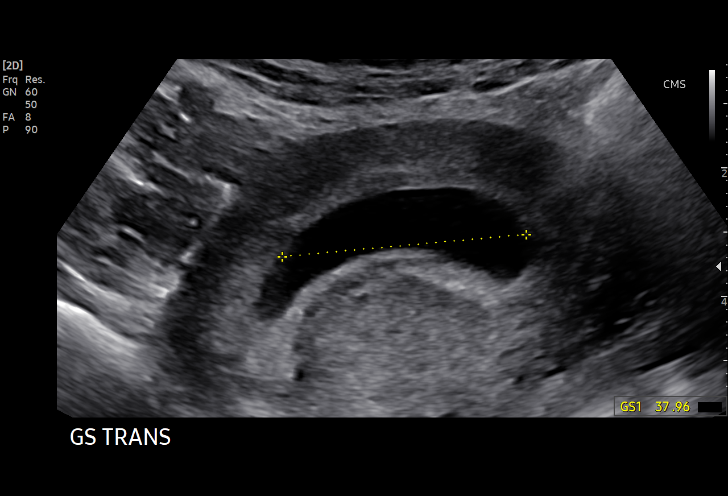
[im 14/45]
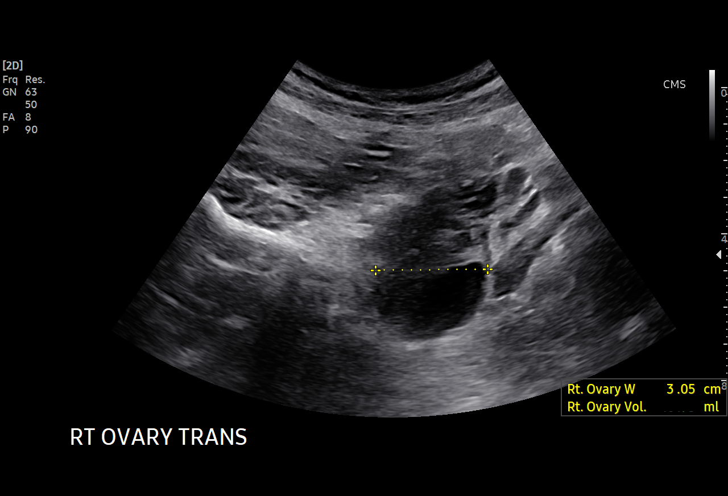
[im 17/45]
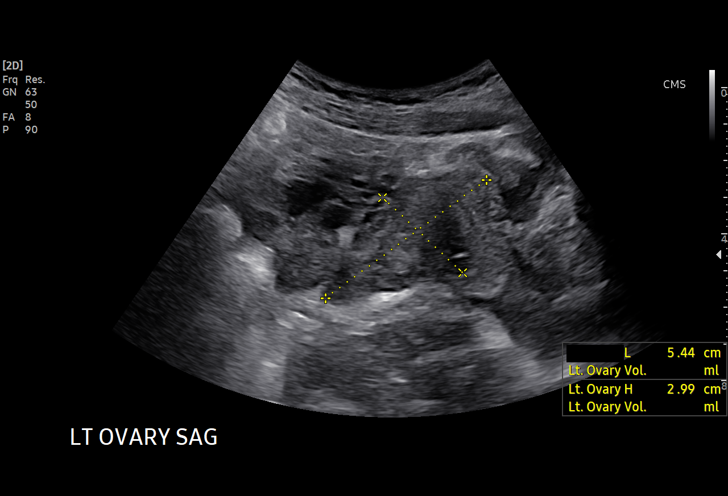
[im 20/45]
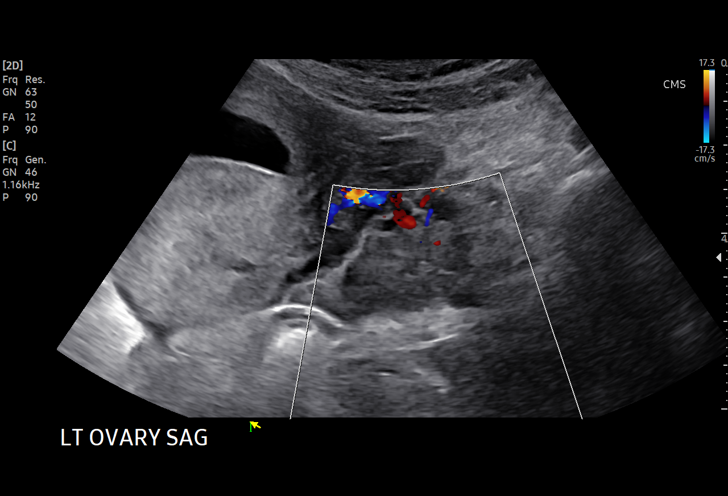
[im 23/45]
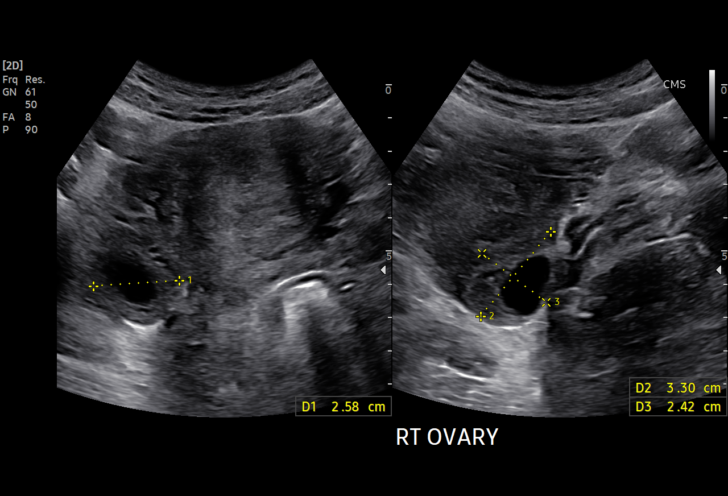
[im 25/45]
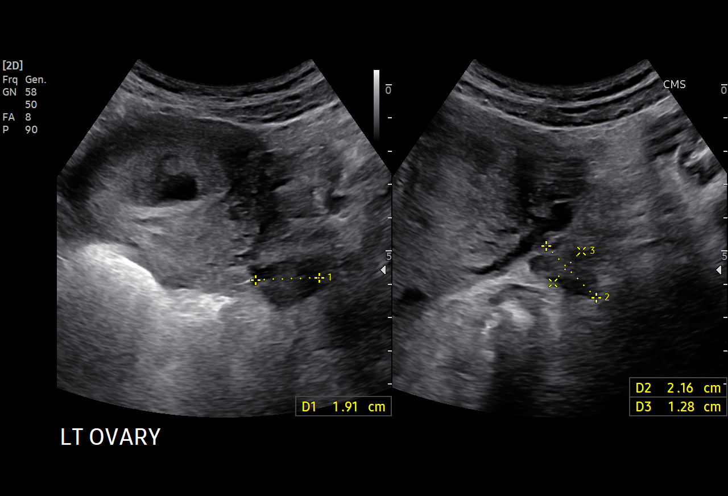
[im 28/45]
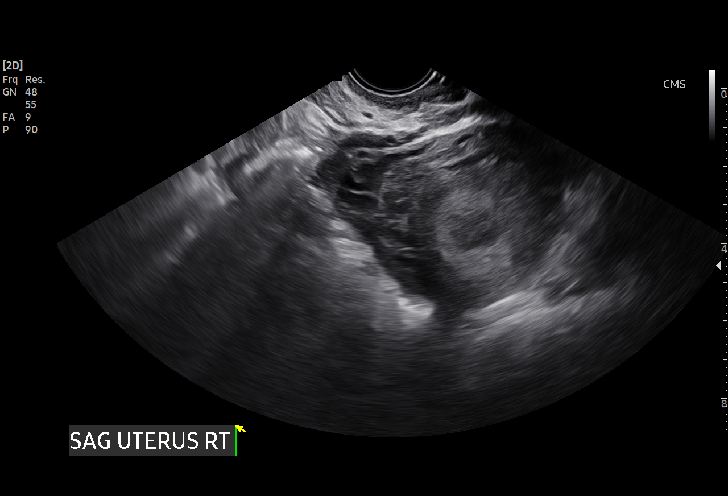
[im 31/45]
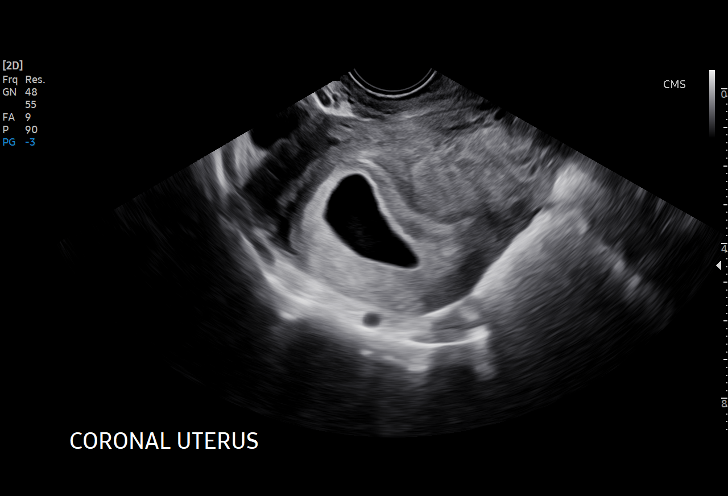
[im 35/45]
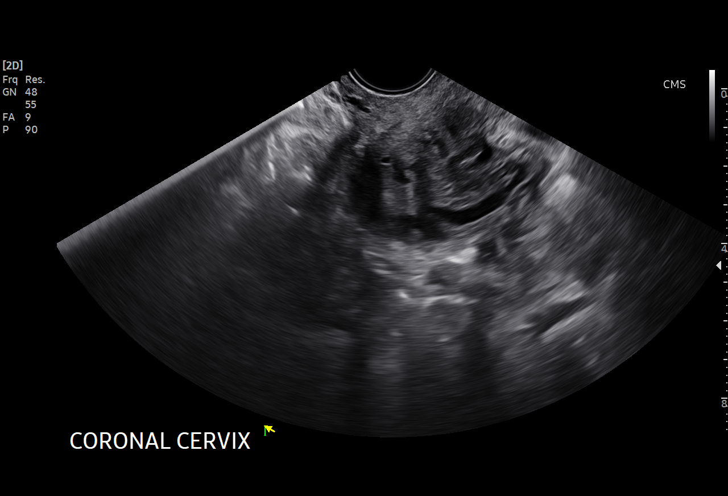
[im 38/45]
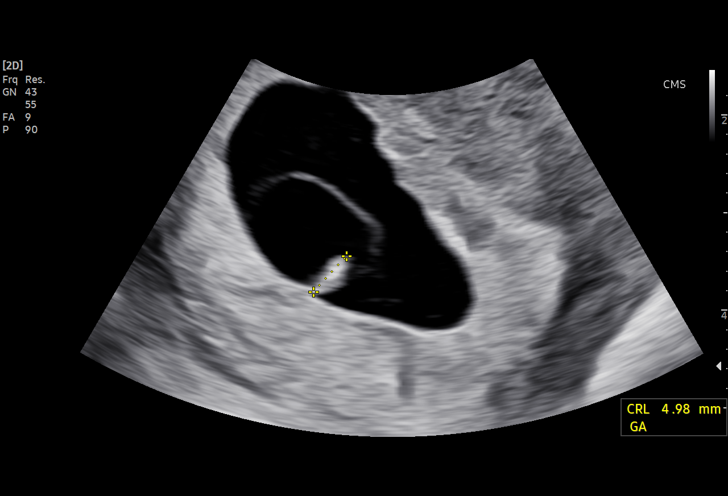
[im 41/45]
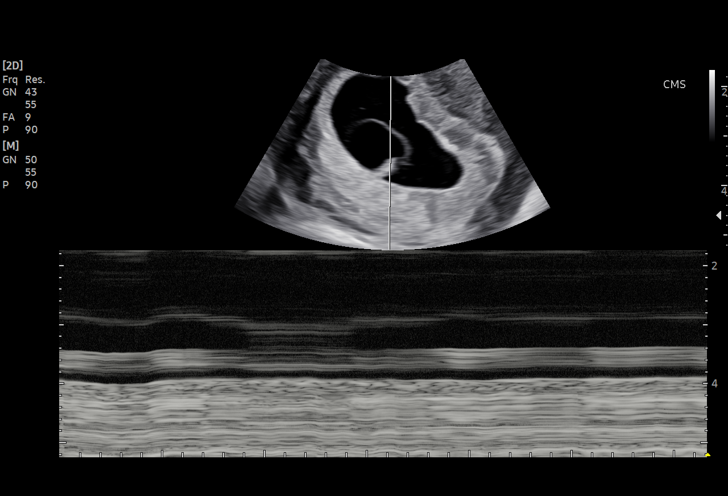
[im 45/45]
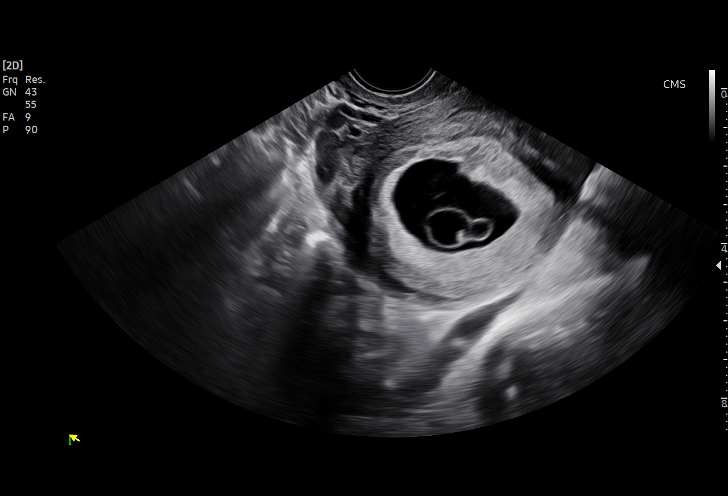

[15 of 28 positions shown; findings below may reference images not displayed]

FINDINGS: Intrauterine gestational sac: Single

Yolk sac:  Visualized.

Embryo:  Visualized.

Cardiac Activity: Not Visualized.

Heart Rate: 0  bpm

CRL:  5.3 mm   6 w   2 d                  US EDC: 01/08/2022

Subchorionic hemorrhage:  None visualized.

Maternal uterus/adnexae: Bilateral ovaries are within normal limits.
No free fluid.
IMPRESSION: 1. Single intrauterine gestation measuring 6 weeks 2 days by
crown-rump length. There is abnormal interval growth and absent
cardiac activity. Findings compatible with failed intrauterine
pregnancy.

## 2023-04-18 IMAGING — US US PELVIS COMPLETE
1 series · 15 of 20 positions shown · non-contrast
Comparison: Obstetric ultrasound 05/31/2021 and 04/27/2021.

CLINICAL DATA: Nonviable pregnancy diagnosed 3 days ago. Vaginal
bleeding.

EXAM:
TRANSABDOMINAL ULTRASOUND OF PELVIS
TECHNIQUE: Transabdominal ultrasound examination of the pelvis was performed
including evaluation of the uterus, ovaries, adnexal regions, and
pelvic cul-de-sac.

[Series 1: us pelvis complete · 15 of 20 slices shown]
[im 1/20]
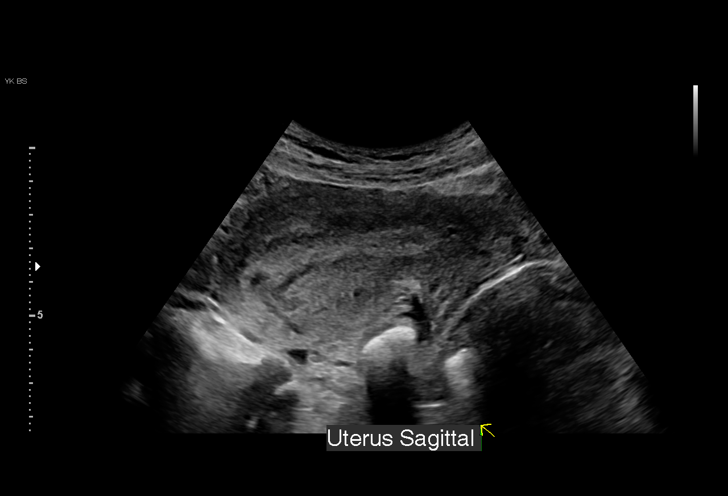
[im 3/20]
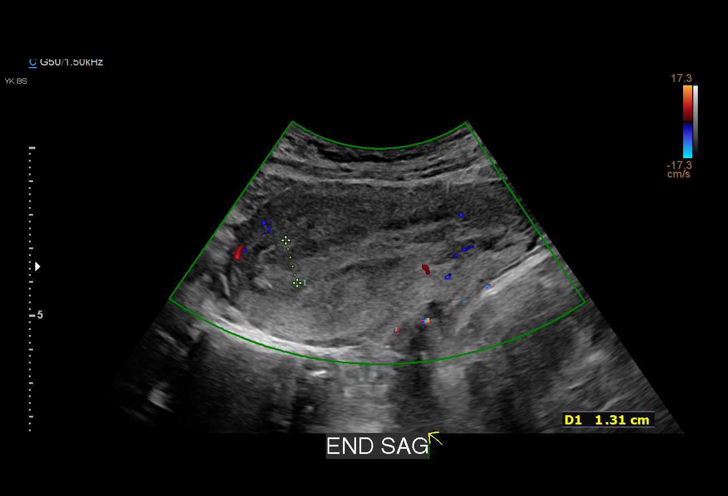
[im 4/20]
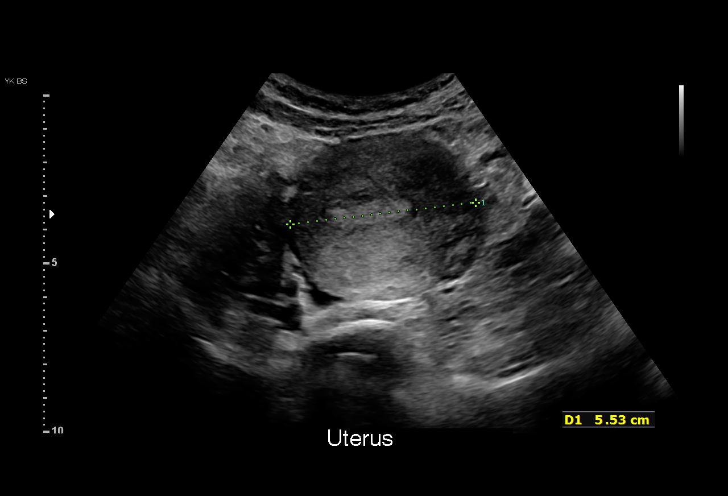
[im 5/20]
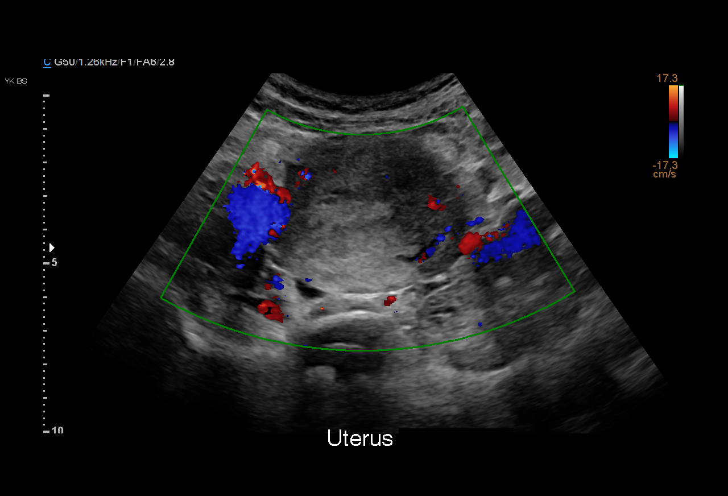
[im 7/20]
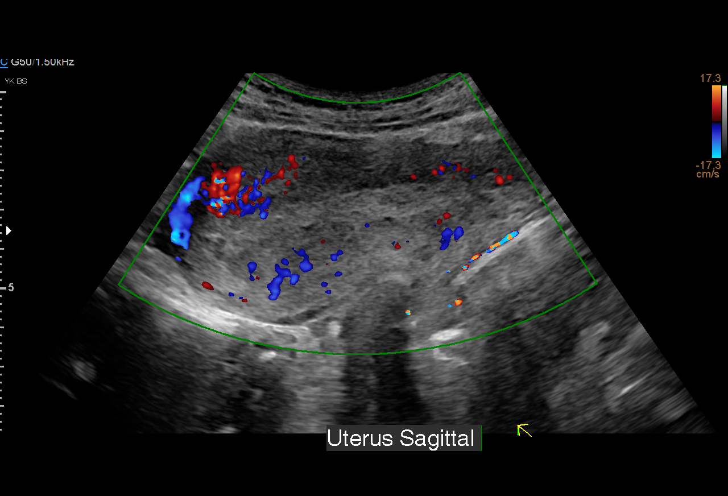
[im 8/20]
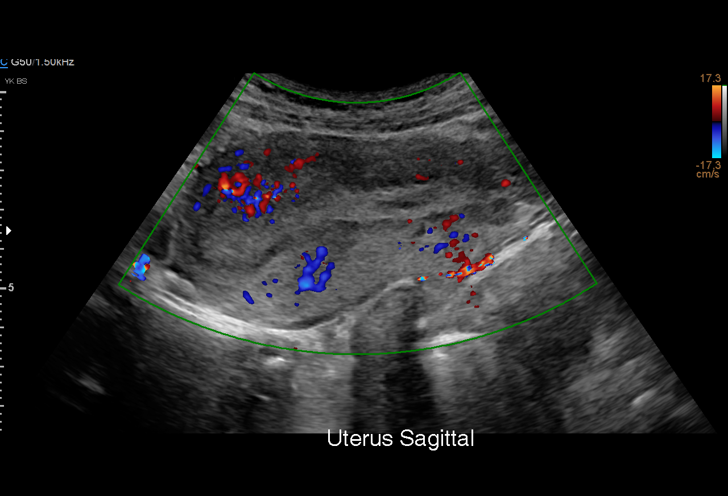
[im 9/20]
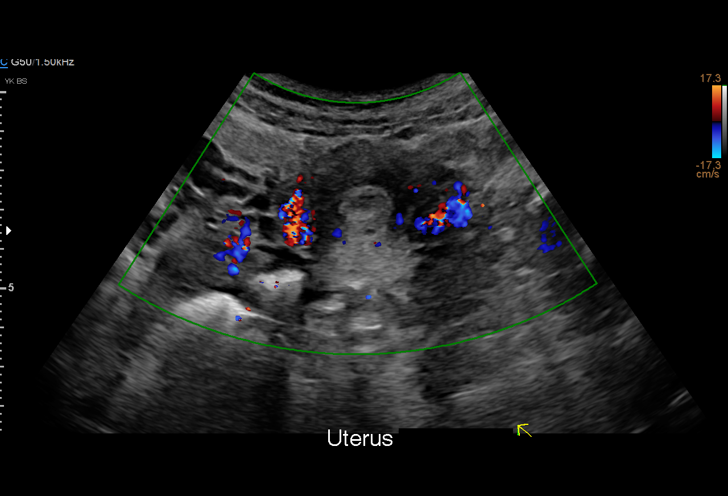
[im 11/20]
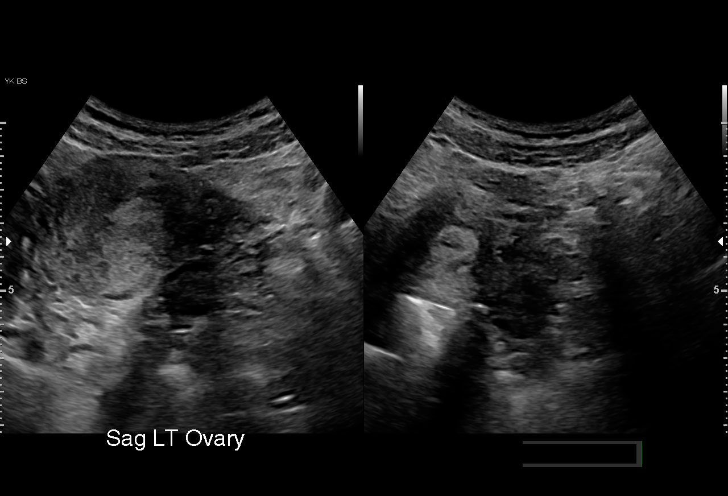
[im 12/20]
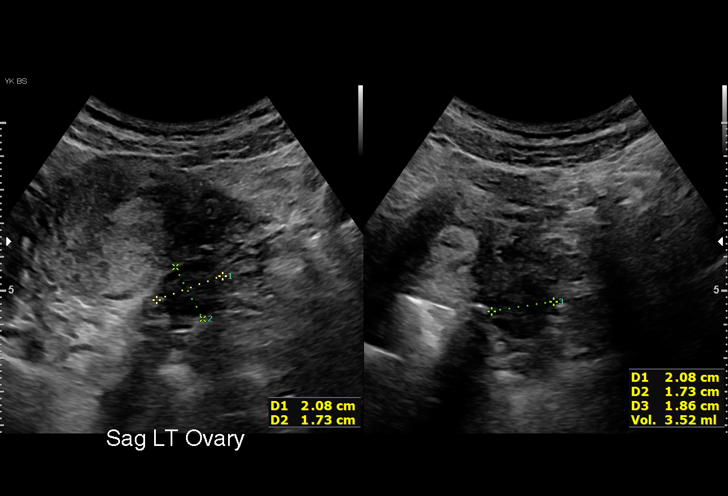
[im 13/20]
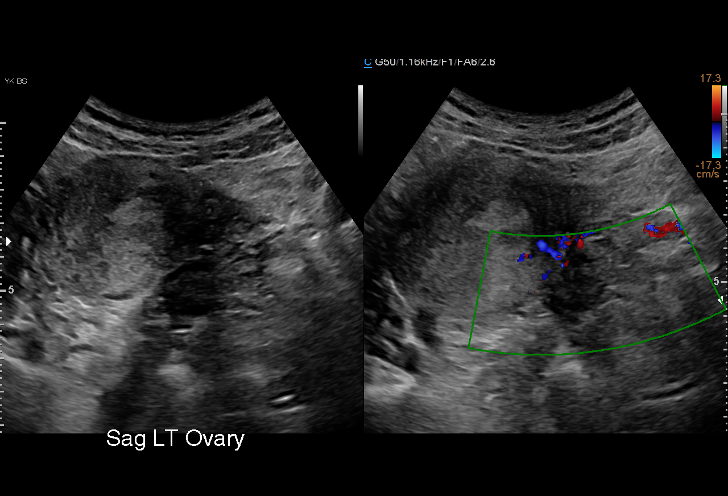
[im 15/20]
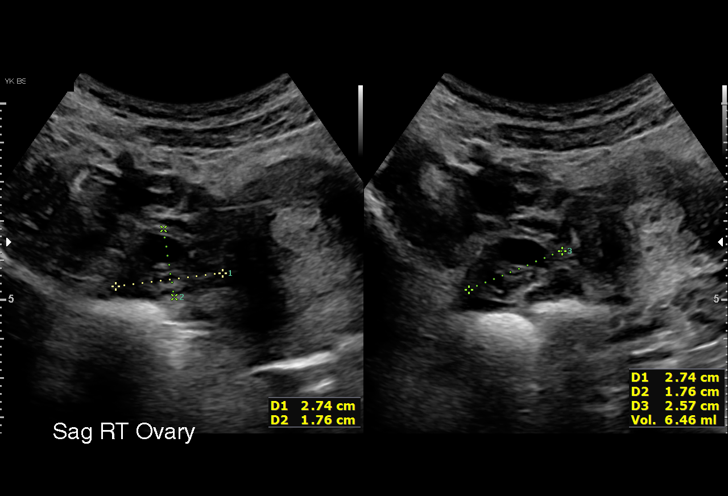
[im 16/20]
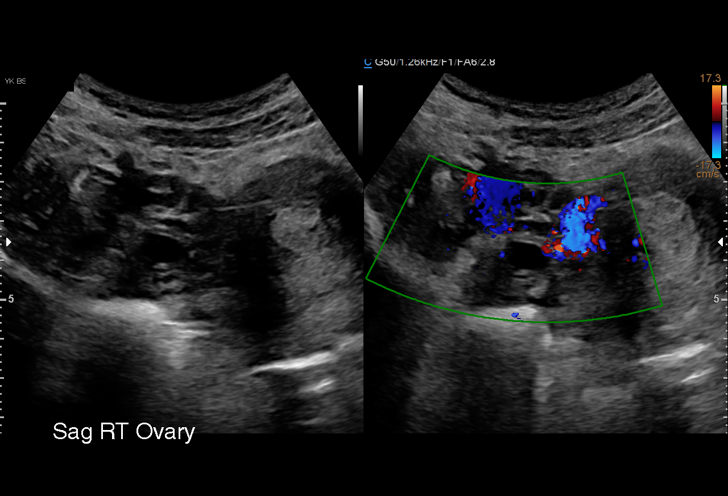
[im 17/20]
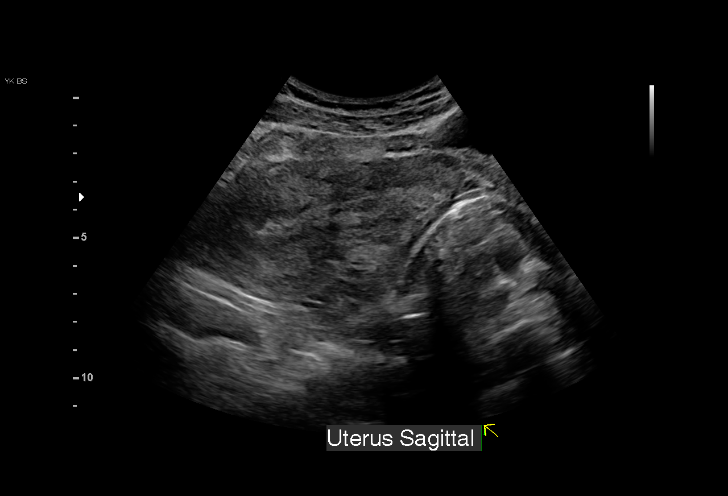
[im 19/20]
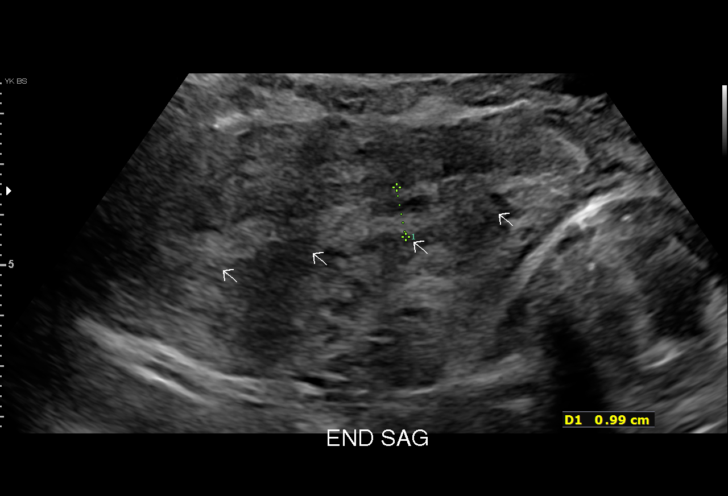
[im 20/20]
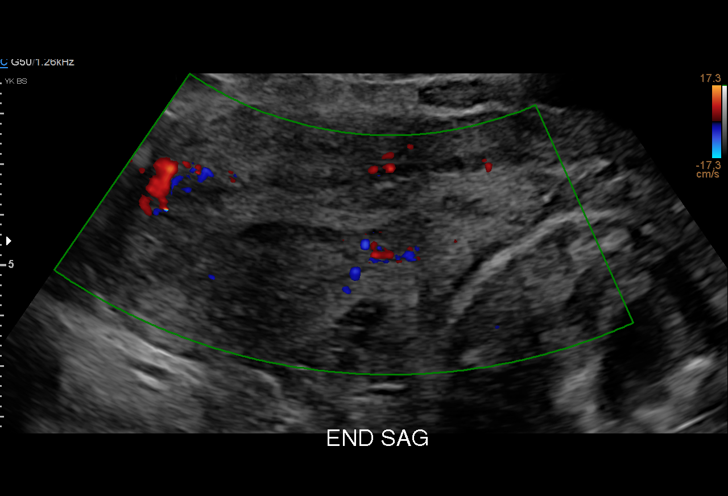

[15 of 20 positions shown; findings below may reference images not displayed]

FINDINGS: Uterus

Measurements: 9.3 x 5.0 x 5.3 cm = volume: 126 mL. No fibroids or
other mass visualized.

Endometrium

Thickness: Mild heterogeneous thickening to 9 mm. No significant
echogenic or shadowing components. No significant hypervascularity
on Doppler ultrasound.

Right ovary

Measurements: 2.7 x 1.8 x 2.6 cm = volume: 6.5 mL. Normal
appearance/no adnexal mass.

Left ovary

Measurements: 2.1 x 1.7 x 1.9 cm = volume: 3.5 mL. Normal
appearance/no adnexal mass.

Other findings:  No abnormal free fluid.
IMPRESSION: 1. Mildly heterogeneous endometrial thickening without focal mass or
hypervascularity. No evidence of retained products of conception.
2. No adnexal mass.
